# Patient Record
Sex: Female | Born: 1965 | Race: White | Hispanic: No | Marital: Married | Smoking: Never smoker
Health system: Southern US, Community
[De-identification: ages and names within clinical notes are randomized; demographics above are authoritative.]

## PROBLEM LIST (undated history)

## (undated) DIAGNOSIS — T7840XA Allergy, unspecified, initial encounter: Secondary | ICD-10-CM

## (undated) DIAGNOSIS — C649 Malignant neoplasm of unspecified kidney, except renal pelvis: Secondary | ICD-10-CM

## (undated) DIAGNOSIS — R87619 Unspecified abnormal cytological findings in specimens from cervix uteri: Secondary | ICD-10-CM

## (undated) DIAGNOSIS — I10 Essential (primary) hypertension: Secondary | ICD-10-CM

## (undated) DIAGNOSIS — D649 Anemia, unspecified: Secondary | ICD-10-CM

## (undated) DIAGNOSIS — T148XXA Other injury of unspecified body region, initial encounter: Secondary | ICD-10-CM

## (undated) DIAGNOSIS — R7611 Nonspecific reaction to tuberculin skin test without active tuberculosis: Secondary | ICD-10-CM

## (undated) DIAGNOSIS — M25559 Pain in unspecified hip: Secondary | ICD-10-CM

## (undated) DIAGNOSIS — R319 Hematuria, unspecified: Secondary | ICD-10-CM

## (undated) DIAGNOSIS — M1990 Unspecified osteoarthritis, unspecified site: Secondary | ICD-10-CM

## (undated) HISTORY — PX: CARPAL TUNNEL RELEASE: SHX101

## (undated) HISTORY — PX: NASAL SINUS SURGERY: SHX719

## (undated) HISTORY — PX: CHOLECYSTECTOMY: SHX55

## (undated) HISTORY — DX: Allergy, unspecified, initial encounter: T78.40XA

## (undated) HISTORY — PX: FOOT SURGERY: SHX648

## (undated) HISTORY — DX: Unspecified abnormal cytological findings in specimens from cervix uteri: R87.619

## (undated) HISTORY — DX: Unspecified osteoarthritis, unspecified site: M19.90

## (undated) HISTORY — PX: CERVICAL CONIZATION W/BX: SHX1330

## (undated) HISTORY — PX: HEEL SPUR SURGERY: SHX665

## (undated) HISTORY — DX: Malignant neoplasm of unspecified kidney, except renal pelvis: C64.9

---

## 1997-11-29 ENCOUNTER — Other Ambulatory Visit: Admission: RE | Admit: 1997-11-29 | Discharge: 1997-11-29 | Payer: Self-pay | Admitting: Gynecology

## 2000-02-03 ENCOUNTER — Other Ambulatory Visit: Admission: RE | Admit: 2000-02-03 | Discharge: 2000-02-03 | Payer: Self-pay | Admitting: *Deleted

## 2000-03-29 ENCOUNTER — Inpatient Hospital Stay (HOSPITAL_COMMUNITY): Admission: AD | Admit: 2000-03-29 | Discharge: 2000-03-29 | Payer: Self-pay | Admitting: Obstetrics & Gynecology

## 2002-10-20 ENCOUNTER — Other Ambulatory Visit: Admission: RE | Admit: 2002-10-20 | Discharge: 2002-10-20 | Payer: Self-pay | Admitting: *Deleted

## 2002-11-24 ENCOUNTER — Ambulatory Visit (HOSPITAL_COMMUNITY): Admission: RE | Admit: 2002-11-24 | Discharge: 2002-11-24 | Payer: Self-pay | Admitting: *Deleted

## 2002-11-24 ENCOUNTER — Encounter: Payer: Self-pay | Admitting: *Deleted

## 2002-12-29 ENCOUNTER — Inpatient Hospital Stay (HOSPITAL_COMMUNITY): Admission: AD | Admit: 2002-12-29 | Discharge: 2002-12-29 | Payer: Self-pay | Admitting: Obstetrics and Gynecology

## 2002-12-29 ENCOUNTER — Encounter: Payer: Self-pay | Admitting: Obstetrics and Gynecology

## 2008-08-30 ENCOUNTER — Ambulatory Visit (HOSPITAL_BASED_OUTPATIENT_CLINIC_OR_DEPARTMENT_OTHER): Admission: RE | Admit: 2008-08-30 | Discharge: 2008-08-30 | Payer: Self-pay | Admitting: Orthopedic Surgery

## 2008-10-11 ENCOUNTER — Ambulatory Visit (HOSPITAL_BASED_OUTPATIENT_CLINIC_OR_DEPARTMENT_OTHER): Admission: RE | Admit: 2008-10-11 | Discharge: 2008-10-11 | Payer: Self-pay | Admitting: Orthopedic Surgery

## 2010-06-23 LAB — POCT HEMOGLOBIN-HEMACUE: Hemoglobin: 11.7 g/dL — ABNORMAL LOW (ref 12.0–15.0)

## 2010-06-24 LAB — POCT HEMOGLOBIN-HEMACUE: Hemoglobin: 11.4 g/dL — ABNORMAL LOW (ref 12.0–15.0)

## 2010-07-30 NOTE — Op Note (Signed)
[name], [name]              ACCOUNT NO.:  [id number]   MEDICAL RECORD NO.:  [id number]          PATIENT TYPE:  AMB   LOCATION:  DSC                          FACILITY:  [facility]   PHYSICIAN:  Cindee Salt, M.D.       DATE OF BIRTH:  [birth year]   DATE OF PROCEDURE:  10/11/2008  DATE OF DISCHARGE:                               OPERATIVE REPORT   PREOPERATIVE DIAGNOSIS:  Carpal tunnel syndrome, left hand.   POSTOPERATIVE DIAGNOSIS:  Carpal tunnel syndrome, left hand.   OPERATION:  Decompression of left median nerve.   SURGEON:  Cindee Salt, MD   ASSISTANT:  Carolyne Fiscal, RN   ANESTHESIA:  General.   ANESTHESIOLOGIST:  Bedelia Person, MD   HISTORY:  The patient is a 45 year old female with a history of carpal  tunnel syndrome bilaterally.  She was admitted for release of her left  carpal tunnel.  Pre, peri, and postoperative courses have been discussed  along with risks and complication.  She is aware that there is no  guarantee with the surgery, possibility of infection, recurrence of  injury to arteries, nerves, and tendons, incomplete relief of symptoms,  and dystrophy.  In the preoperative area, the patient is seen.  The  extremity was marked by both the patient and surgeon.  Antibiotic was  given.   PROCEDURE:  The patient was brought to the operating room where a  general anesthetic was carried out without difficulty under the  direction of Dr. Gypsy Balsam.  She was prepped using ChloraPrep, supine  position, left arm free, 3-minute dry time was taken along with the  timeout.  She was draped.  The limb exsanguinated with an Esmarch  bandage.  Tourniquet was placed on the forearm and inflated to 230 mmHg.  A longitudinal incision was made in the palm carried down through the  subcutaneous tissue.  Bleeders were electrocauterized.  Palmar fascia  was split.  Superficial palmar arch identified.  Flexor tendon to the  ring little finger identified to the ulnar side of the median nerve.  The  carpal retinaculum was incised with sharp dissection.  A right-angle  and Sewall retractor were placed between the skin and forearm fascia.  The fascia released for approximately 1.5 cm proximal to the wrist  crease under direct vision.  Canal was explored.  Area of compression of  the nerve was apparent.  No further lesions were identified.  The wound  was irrigated and skin closed with interrupted 5-0 Vicryl Rapide  sutures.  Local infiltration was given with 0.25% Marcaine without  epinephrine, approximately 6 mL was used.  A sterile compressive  dressing  and splint was applied.  Deflation of the tourniquet, all fingers  immediately pinked.  She was taken to the recovery room for observation  in satisfactory condition.  She will be discharged home to return to the  [facility] [place] in 1 week on Darvocet at her request.           ______________________________  Cindee Salt, M.D.     GK/MEDQ  D:  10/11/2008  T:  10/12/2008  Job:  [id number] 

## 2010-07-30 NOTE — Op Note (Signed)
[name], [name]              ACCOUNT NO.:  [id number]   MEDICAL RECORD NO.:  [id number]          PATIENT TYPE:  AMB   LOCATION:  DSC                          FACILITY:  [facility]   PHYSICIAN:  Cindee Salt, M.D.       DATE OF BIRTH:  08-Jul-1965   DATE OF PROCEDURE:  08/30/2008  DATE OF DISCHARGE:                               OPERATIVE REPORT   PREOPERATIVE DIAGNOSIS:  Carpal tunnel syndrome, right hand.   POSTOPERATIVE DIAGNOSIS:  Carpal tunnel syndrome, right hand.   OPERATION:  Decompression of right median nerve.   SURGEON:  Cindee Salt, MD   ASSISTANT:  Carolyne Fiscal, RN   ANESTHESIA:  Forearm-based IV regional general with local infiltration.   ANESTHESIOLOGIST:  Quita Skye. Krista Blue, MD   HISTORY:  The patient is a 45 year old female with a history of carpal  tunnel syndrome, EMG nerve conduction is positive.  This has not  responded to conservative treatment.  She has elected to undergo  surgical decompression.  Pre, peri, and postoperative courses have been  discussed along with risks and complications.  She is aware that there  was no guarantee with the surgery, possibility of infection, recurrence  of injury to arteries, nerves, and tendons, incomplete relief of  symptoms, and dystrophy.  In the preoperative area, the patient was  seen.  The extremity was marked by both the patient and surgeon.  Antibiotic was given.   PROCEDURE:  The patient was brought to the operating room where a  forearm-based IV regional anesthetic was carried out without difficulty.  Her blood pressure went up producing a venous tourniquet with increased  pain.  A general anesthetic was then afforded under the direction of Dr.  Krista Blue.  She was prepped using DuraPrep supine position with the right  arm free.  The tourniquet was deflated.  A 3-minute dry time was  allowed.  A time-out was taken confirming the patient and procedure.  The patient was then draped.  The limb exsanguinated with an Esmarch  bandage.  Tourniquet placed on the forearm inflated to 280 mmHg.  A  longitudinal incision was made in the palm carried down through the  subcutaneous tissue.  Bleeders were electrocauterized.  Palmar fascia  was split.  Superficial palmar arch identified.  The flexor tendon to  the ring little finger identified to the ulnar side of the median nerve.  The carpal retinaculum was incised with sharp dissection.  A right angle  and Sewall retractor were placed between skin and forearm fascia.  Fascia was released for approximately a centimeter and half proximal to  the wrist crease under direct vision.  The canal was explored.  No  further lesions were identified.  The area of compression to the nerve  was apparent.  The wound was copiously irrigated with saline.  The skin  was closed with interrupted 5-0 Vicryl Rapide sutures.  Hemostasis was  afforded throughout the procedure using bipolar.  The area was then  locally infiltrated with 0.25% Marcaine without epinephrine, 5 mL used.  A sterile compressive dressing  and splint applied.  Deflation of the tourniquet,  all fingers  immediately pinked.  She was taken to the recovery room for observation  in satisfactory condition.  She will be discharged home to return to the  [facility] [place] in 1 week on Nucynta.           ______________________________  Cindee Salt, M.D.     GK/MEDQ  D:  08/30/2008  T:  08/31/2008  Job:  [id number]

## 2010-08-02 NOTE — Consult Note (Signed)
   [name], [name]                        ACCOUNT NO.:  [id number]   MEDICAL RECORD NO.:  [id number]                   PATIENT TYPE:  MAT   LOCATION:  MATC                                 FACILITY:  [facility]   PHYSICIAN:  Lenoard Aden, M.D.             DATE OF BIRTH:  02-Dec-1965   DATE OF CONSULTATION:  12/29/2002  DATE OF DISCHARGE:                                   CONSULTATION   CONSULTING PHYSICIAN:  Lenoard Aden, M.D.   CHIEF COMPLAINT:  Bleeding.   HISTORY OF PRESENT ILLNESS:  This patient is a 45 year old white female, G1,  P0 with history of donor IUI conception at approximately four weeks  gestation with cramping and spotting this evening.   PAST MEDICAL HISTORY:  Past medical history is remarkable for female factor  infertility.  No other medical or surgical hospitalizations.   ALLERGIES:  No known drug allergies.   MEDICATIONS:  Prenatal vitamins.   PHYSICAL EXAMINATION:  VITAL SIGNS:  Temperature 98.4, pulse 88,  respirations 20 and blood pressure 131/76.  HEENT:  Normal.  LUNGS:  Clear.  HEART: Regular rate and rhythm.  ABDOMEN: Soft and nontender.  PELVIC EXAMINATION:  pelvic exam reveals a normal-sized uterus.  No adnexal  masses.  Ultrasound confirms a thickened endometrium, but no evidence of intrauterine  sac.  No evidence of ectopic.  Small corpus luteum cyst noted.  EXTREMITIES:  Extremities reveal no cords bilaterally.  NEUROLOGIC EXAMINATION:  Nonfocal.   LABORATORY DATA:  Quantitative HCG of 99 is noted.  Blood type is A  positive.   IMPRESSION:  Probable complete abortion with a drop in human chorionic  gonadotropin titer from 162 on December 19, 2002 to 99 today.  The patient is  bleeding minimally at this time and is stable on physical examination.   The patient will follow up in the office in one week for quantitative HCG  and follow the HCG down to zero, then with normal cycle will proceed with  resumption of Clomid use intrauterine  imsemination.                                                 Lenoard Aden, M.D.    RJT/MEDQ  D:  12/29/2002  T:  12/29/2002  Job:  [id number]   cc:   [facility]

## 2014-01-02 ENCOUNTER — Ambulatory Visit (INDEPENDENT_AMBULATORY_CARE_PROVIDER_SITE_OTHER): Payer: BC Managed Care – PPO | Admitting: Sports Medicine

## 2014-01-02 ENCOUNTER — Emergency Department (INDEPENDENT_AMBULATORY_CARE_PROVIDER_SITE_OTHER): Payer: BC Managed Care – PPO

## 2014-01-02 ENCOUNTER — Encounter: Payer: Self-pay | Admitting: Emergency Medicine

## 2014-01-02 ENCOUNTER — Emergency Department (INDEPENDENT_AMBULATORY_CARE_PROVIDER_SITE_OTHER)
Admission: EM | Admit: 2014-01-02 | Discharge: 2014-01-02 | Disposition: A | Discharge status: Home / Self Care | Payer: BC Managed Care – PPO | Source: Home / Self Care | Attending: Emergency Medicine | Admitting: Emergency Medicine

## 2014-01-02 DIAGNOSIS — M25562 Pain in left knee: Secondary | ICD-10-CM

## 2014-01-02 DIAGNOSIS — M2242 Chondromalacia patellae, left knee: Secondary | ICD-10-CM

## 2014-01-02 DIAGNOSIS — M170 Bilateral primary osteoarthritis of knee: Secondary | ICD-10-CM | POA: Insufficient documentation

## 2014-01-02 DIAGNOSIS — M25462 Effusion, left knee: Secondary | ICD-10-CM

## 2014-01-02 HISTORY — DX: Essential (primary) hypertension: I10

## 2014-01-02 MED ORDER — DICLOFENAC SODIUM 75 MG PO TBEC: 75.0000 mg | DELAYED_RELEASE_TABLET | Freq: Two times a day (BID) | ORAL | Status: DC

## 2014-01-02 NOTE — ED Provider Notes (Signed)
CSN: [id number]     Arrival date & time 01/02/14  4010 History   First MD Initiated Contact with Patient 01/02/14 0820     Chief Complaint  Patient presents with  . Knee Pain    left    HPI  Pt presents today with chief complaint of knee pain Pt states that she has had recurring knee pain over the past 2 months  No known injury per pt Though she walks for extended periods of time in the hospital  States pain is mainly anterior, though she also has post knee fullness  Knee pain worse in anterior compartment with bending Mild knee giving away at times  No prior hx/o knee injury in the past Saw PCP for sxs w/in the past month-was given mobic and muscle relaxer with minimal improvement in sxs.   Past Medical History  Diagnosis Date  . Hypertension    Past Surgical History  Procedure Laterality Date  . Nasal sinus surgery    . Carpal tunnel release    . Heel spur surgery    . Cholecystectomy     Family History  Problem Relation Age of Onset  . Heart disease Mother   . Heart disease Father    History  Substance Use Topics  . Smoking status: Never Smoker   . Smokeless tobacco: Never Used  . Alcohol Use: No   OB History   Grav Para Term Preterm Abortions TAB SAB Ect Mult Living                 Review of Systems  All other systems reviewed and are negative.   Allergies  Codeine  Home Medications   Prior to Admission medications   Medication Sig Start Date End Date Taking? Authorizing Provider  hydrochlorothiazide (HYDRODIURIL) 25 MG tablet Take 25 mg by mouth daily.   Yes Historical Provider, MD  meloxicam (MOBIC) 15 MG tablet Take 15 mg by mouth daily.   Yes Historical Provider, MD   LMP 11/25/2013 Physical Exam  Constitutional: She appears well-developed.  moderately obese    HENT:  Head: Normocephalic and atraumatic.  Eyes: Conjunctivae are normal. Pupils are equal, round, and reactive to light.  Cardiovascular: Normal rate and regular rhythm.    Pulmonary/Chest: Effort normal and breath sounds normal.  Abdominal: Soft.  Musculoskeletal:  + TTP across anterior knee, mainly over patellar tendon + pain with resisted knee flexion and extension + mild fullness in post knee   Neurological: She is alert.  Skin: Skin is warm.    ED Course  Procedures (including critical care time) Labs Review Labs Reviewed - No data to display  Imaging Review Dg Knee Complete 4 Views Left  01/02/2014   CLINICAL DATA:  Anterior left knee pain. No known injury. Initial encounter.  EXAM: LEFT KNEE - COMPLETE 4+ VIEW  COMPARISON:  None.  FINDINGS: The patient has a moderate to large large joint effusion. No fracture or dislocation is identified. No focal bony lesion is seen. Joint spaces are preserved. No chondrocalcinosis is identified.  IMPRESSION: Moderate to large joint effusion without underlying bony or joint abnormality.   Electronically Signed   By: Inge Rise M.D.   On: 01/02/2014 09:21     MDM   1. Knee pain, left   2. Knee effusion, left    Will likely need therapeutic draining of L knee  Will formally consult sports medicine as ? U/s guided drainage may be more beneficial  Discussed plan-pt is agreeable  Follow up and treatment per sports medicine    The patient and/or caregiver has been counseled thoroughly with regard to treatment plan and/or medications prescribed including dosage, schedule, interactions, rationale for use, and possible side effects and they verbalize understanding. Diagnoses and expected course of recovery discussed and will return if not improved as expected or if the condition worsens. Patient and/or caregiver verbalized understanding.         Shanda Howells, MD 01/02/14 1001

## 2014-01-02 NOTE — Progress Notes (Signed)
   Subjective:    I'm seeing this patient as a consultation for:  Dr. Shanda Howells  CC: Left knee pain  HPI: This is a very pleasant 48 year old female, for some time now she's had pain that she localizes under the left kneecap, moderate, persistent without radiation. Worse with going up and down stairs, squatting, and getting up out of a chair.  Past medical history, Surgical history, Family history not pertinant except as noted below, Social history, Allergies, and medications have been entered into the medical record, reviewed, and no changes needed.   Review of Systems: No headache, visual changes, nausea, vomiting, diarrhea, constipation, dizziness, abdominal pain, skin rash, fevers, chills, night sweats, weight loss, swollen lymph nodes, body aches, joint swelling, muscle aches, chest pain, shortness of breath, mood changes, visual or auditory hallucinations.   Objective:   General: Well Developed, well nourished, and in no acute distress.  Neuro/Psych: Alert and oriented x3, extra-ocular muscles intact, able to move all 4 extremities, sensation grossly intact. Skin: Warm and dry, no rashes noted.  Respiratory: Not using accessory muscles, speaking in full sentences, trachea midline.  Cardiovascular: Pulses palpable, no extremity edema. Abdomen: Does not appear distended. Left Knee: Visible and palpable effusion. Tender to palpation under the medial and lateral patellar facets. ROM normal in flexion and extension and lower leg rotation. Ligaments with solid consistent endpoints including ACL, PCL, LCL, MCL. Negative Mcmurray's and provocative meniscal tests. Non painful patellar compression. Patellar and quadriceps tendons unremarkable. Hamstring and quadriceps strength is normal.  Procedure: Real-time Ultrasound Guided aspiration/Injection of left knee Device: GE Logiq E  Verbal informed consent obtained.  Time-out conducted.  Noted no overlying erythema, induration, or  other signs of local infection.  Skin prepped in a sterile fashion.  Local anesthesia: Topical Ethyl chloride.  With sterile technique and under real time ultrasound guidance:  20 cc of straw-colored fluid was aspirated, syringe which and 2 cc Kenalog 40, 4 cc lidocaine injected easily. Completed without difficulty  Pain immediately resolved suggesting accurate placement of the medication.  Advised to call if fevers/chills, erythema, induration, drainage, or persistent bleeding.  Images permanently stored and available for review in the ultrasound unit.  Impression: Technically successful ultrasound guided injection.  Impression and Recommendations:   This case required medical decision making of moderate complexity.

## 2014-01-02 NOTE — ED Notes (Signed)
[name] c/o left knee pain without injury x 2 months. Seen by PCP who gave Meloxicam and a muscle relaxer, no xray done. No previous injuries.

## 2014-01-02 NOTE — ED Provider Notes (Signed)
CSN: [id number]     Arrival date & time 01/02/14  2878 History   First MD Initiated Contact with Patient 01/02/14 0820     Chief Complaint  Patient presents with  . Knee Pain    left   (Consider location/radiation/quality/duration/timing/severity/associated sxs/prior Treatment) HPI [name] c/o left knee pain without injury x 2 months. Seen by PCP who gave Meloxicam and a muscle relaxer, no xray done. No previous injuries. The left anterior knee pain is worse when walking downhill. No chest pain or shortness of breath. 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 Hypertension    Past Surgical History  Procedure Laterality Date  . Nasal sinus surgery    . Carpal tunnel release    . Heel spur surgery    . Cholecystectomy     Family History  Problem Relation Age of Onset  . Heart disease Mother   . Heart disease Father    History  Substance Use Topics  . Smoking status: Never Smoker   . Smokeless tobacco: Never Used  . Alcohol Use: No   OB History   Grav Para Term Preterm Abortions TAB SAB Ect Mult Living                 Review of Systems  All other systems reviewed and are negative.   Allergies  Codeine  Home Medications   Prior to Admission medications   Medication Sig Start Date End Date Taking? Authorizing Provider  hydrochlorothiazide (HYDRODIURIL) 25 MG tablet Take 25 mg by mouth daily.   Yes Historical Provider, MD  meloxicam (MOBIC) 15 MG tablet Take 15 mg by mouth daily.   Yes Historical Provider, MD   LMP 11/25/2013 Physical Exam  Nursing note and vitals reviewed. Constitutional: She is oriented to person, place, and time. She appears well-developed and well-nourished. No distress.  HENT:  Head: Normocephalic and atraumatic.  Eyes: Conjunctivae and EOM are normal. Pupils are equal, round, and reactive to  light. No scleral icterus.  Neck: Normal range of motion.  Cardiovascular: Normal rate.   Pulmonary/Chest: Effort normal.  Abdominal: She exhibits no distension.  Musculoskeletal: Normal range of motion.       Left knee: She exhibits swelling. Tenderness found. Medial joint line, lateral joint line and patellar tendon tenderness noted.  Some superficial varicosities left lower extremity. No calf tenderness. No cords felt. No significant pretibial edema. Negative Homans sign  Neurological: She is alert and oriented to person, place, and time.  Skin: Skin is warm.  Psychiatric: She has a normal mood and affect.   Very tender over patella tendon ED Course  Procedures (including critical care time) Labs Review Labs Reviewed - No data to display  Imaging Review No results found. Note: I was just called to an emergency situation in another department in the [facility]. Dr. Shanda Howells will be continuing care for patient. I've ordered x-ray left knee. I informed patient that Dr. Ernestina Patches will be reevaluating and treating patient when x-ray comes back.  MDM  No diagnosis found. Left knee pain    Jacqulyn Cane, MD 01/02/14 [phone]

## 2014-01-02 NOTE — Assessment & Plan Note (Addendum)
Aspiration of 20 cc, injection. Voltaren. Formal physical therapy. Return in one month.

## 2014-05-11 ENCOUNTER — Encounter: Payer: Self-pay | Admitting: Sports Medicine

## 2014-05-11 ENCOUNTER — Ambulatory Visit (INDEPENDENT_AMBULATORY_CARE_PROVIDER_SITE_OTHER): Payer: Self-pay | Admitting: Sports Medicine

## 2014-05-11 DIAGNOSIS — M2242 Chondromalacia patellae, left knee: Secondary | ICD-10-CM

## 2014-05-11 NOTE — Progress Notes (Signed)
  Subjective:    CC: left knee pain  HPI: This is a pleasant 49 year old female, she is obese, she has left knee patellofemoral chondromalacia, 3-4 months ago we didn't aspiration and injection and she did very well. She has not had any physical therapy, and is now having recurrence of pain. Is moderate, persistent with swelling, and localized under the kneecap, worse with going down stairs. No mechanical symptoms.  Past medical history, Surgical history, Family history not pertinant except as noted below, Social history, Allergies, and medications have been entered into the medical record, reviewed, and no changes needed.   Review of Systems: No fevers, chills, night sweats, weight loss, chest pain, or shortness of breath.   Objective:    General: Well Developed, well nourished, and in no acute distress.  Neuro: Alert and oriented x3, extra-ocular muscles intact, sensation grossly intact.  HEENT: Normocephalic, atraumatic, pupils equal round reactive to light, neck supple, no masses, no lymphadenopathy, thyroid nonpalpable.  Skin: Warm and dry, no rashes. Cardiac: Regular rate and rhythm, no murmurs rubs or gallops, no lower extremity edema.  Respiratory: Clear to auscultation bilaterally. Not using accessory muscles, speaking in full sentences. Left Knee: Visible and palpable effusion, mild, with tenderness at the patellar facets. ROM normal in flexion and extension and lower leg rotation. Ligaments with solid consistent endpoints including ACL, PCL, LCL, MCL. Negative Mcmurray's and provocative meniscal tests. Non painful patellar compression. Patellar and quadriceps tendons unremarkable. Hamstring and quadriceps strength is normal. Hip abductor's are significantly weak on the left side compared to the right  Procedure: Real-time Ultrasound Guided Aspiration/Injection of left knee Device: GE Logiq E  Verbal informed consent obtained.  Time-out conducted.  Noted no overlying  erythema, induration, or other signs of local infection.  Skin prepped in a sterile fashion.  Local anesthesia: Topical Ethyl chloride.  With sterile technique and under real time ultrasound guidance:  10 mL straw-colored fluid aspirated, syringe switched and 2 mL kenalog 40, 4 mL lidocaine injected easily Completed without difficulty  Pain immediately resolved suggesting accurate placement of the medication.  Advised to call if fevers/chills, erythema, induration, drainage, or persistent bleeding.  Images permanently stored and available for review in the ultrasound unit.  Impression: Technically successful ultrasound guided injection.  Impression and Recommendations:    I spent 40 minutes with this patient, greater than 50% was face-to-face time counseling regarding the above diagnoses,describing the pathophysiology and biomechanics of her disease.

## 2014-05-11 NOTE — Assessment & Plan Note (Signed)
Repeat aspiration and injection today. Formal physical therapy. She will work with her PCP on weight loss, and possibly weight loss medication. I've also given her a handout on Orthovisc. Hip abductor's are significantly weak.

## 2014-06-07 ENCOUNTER — Ambulatory Visit: Payer: Self-pay | Admitting: Sports Medicine

## 2014-06-21 ENCOUNTER — Encounter: Payer: Self-pay | Admitting: Sports Medicine

## 2014-06-21 ENCOUNTER — Ambulatory Visit (INDEPENDENT_AMBULATORY_CARE_PROVIDER_SITE_OTHER): Payer: BLUE CROSS/BLUE SHIELD | Admitting: Sports Medicine

## 2014-06-21 VITALS — BP 111/70 | HR 89 | Ht 63.0 in | Wt 234.0 lb

## 2014-06-21 DIAGNOSIS — M2242 Chondromalacia patellae, left knee: Secondary | ICD-10-CM | POA: Diagnosis not present

## 2014-06-21 DIAGNOSIS — E669 Obesity, unspecified: Secondary | ICD-10-CM

## 2014-06-21 MED ORDER — PHENTERMINE HCL 37.5 MG PO TABS: ORAL_TABLET | ORAL | Status: DC

## 2014-06-21 NOTE — Progress Notes (Signed)
  Subjective:    CC: follow-up  HPI: Left knee pain: Clinically represents patellofemoral chondromalacia with mild osteoarthritis, pain is moderate, persistent, she responded well to an injection however still has some pain, did not do any physical therapy due to costs.  Unfortunately she is also not seen her primary care provider to discuss weight loss, she has in fact gained weight.  Past medical history, Surgical history, Family history not pertinant except as noted below, Social history, Allergies, and medications have been entered into the medical record, reviewed, and no changes needed.   Review of Systems: No fevers, chills, night sweats, weight loss, chest pain, or shortness of breath.   Objective:    General: Well Developed, well nourished, and in no acute distress.  Neuro: Alert and oriented x3, extra-ocular muscles intact, sensation grossly intact.  HEENT: Normocephalic, atraumatic, pupils equal round reactive to light, neck supple, no masses, no lymphadenopathy, thyroid nonpalpable.  Skin: Warm and dry, no rashes. Cardiac: Regular rate and rhythm, no murmurs rubs or gallops, no lower extremity edema.  Respiratory: Clear to auscultation bilaterally. Not using accessory muscles, speaking in full sentences. Left Knee: Normal to inspection with no erythema or effusion or obvious bony abnormalities. Still with pain around the patellar facets. ROM normal in flexion and extension and lower leg rotation. Ligaments with solid consistent endpoints including ACL, PCL, LCL, MCL. Negative Mcmurray's and provocative meniscal tests. Non painful patellar compression. Patellar and quadriceps tendons unremarkable. Hamstring and quadriceps strength is normal. Hip abductor's are still considerably weak on the left side.  We spent a considerable amount of time teaching her various hip abductor rehabilitation exercises.  Impression and Recommendations:

## 2014-06-21 NOTE — Assessment & Plan Note (Signed)
Has not yet been able to get phentermine from her PCP, I am going to start it, and she can continue with her PCP for monthly weight checks and refills.

## 2014-06-21 NOTE — Assessment & Plan Note (Signed)
Improved with the previous aspiration and injection, continuing to have pain, has not taken any physical therapy, or worked on weight loss. I taught [name] hip abductor exercises today, and we have provided 2 yards of therapy and in 2 different strengths. She will do 3 sets of 15 with both side walking, as well as hip abduction's. Return to see me in one month. I would also like to get [name] approved for Orthovisc.

## 2014-06-23 ENCOUNTER — Telehealth: Payer: Self-pay | Admitting: Physician Assistant

## 2014-06-23 NOTE — Telephone Encounter (Signed)
[name], would you be able to check for her preferred agent, it may simply be synvisc, supartz, or euflexxa.  If something else is covered I'll just give her an rx and she will bring the boxes here.

## 2014-06-23 NOTE — Telephone Encounter (Signed)
Patient was interested in OrthoVisc injections for her knee pain. Received information regarding insurance coverage, these injections will not be covered. Attempted to contact Pt to advise, no answer. Left voicemail and provided callback information.

## 2014-06-23 NOTE — Telephone Encounter (Signed)
Called Pt's insurance to see what injections would be approved. Spoke with Jenny Reichmann, a representative, and was able to complete a prior authorization for OrthoVisc. Auth #: [id number] Valid dates (05/24/14-07/28/14). Was informed these injections would need to be filled using [facility].  [place], [phone], and spoke with Inez Catalina- was advised the total OOP cost for the patient for all injections would be $295.28. Contacted Pt, advised of her OOP cost. Pt states she wants to think about this price and will let us know if she wants to order them. verbalized understanding and reminded Pt of her valid dates. No further questions at this time.

## 2014-07-20 ENCOUNTER — Ambulatory Visit (INDEPENDENT_AMBULATORY_CARE_PROVIDER_SITE_OTHER): Payer: BLUE CROSS/BLUE SHIELD | Admitting: Sports Medicine

## 2014-07-20 ENCOUNTER — Encounter: Payer: Self-pay | Admitting: Sports Medicine

## 2014-07-20 VITALS — BP 130/79 | HR 81 | Wt 229.0 lb

## 2014-07-20 DIAGNOSIS — E669 Obesity, unspecified: Secondary | ICD-10-CM

## 2014-07-20 DIAGNOSIS — M2242 Chondromalacia patellae, left knee: Secondary | ICD-10-CM

## 2014-07-20 MED ORDER — PHENTERMINE HCL 37.5 MG PO TABS: ORAL_TABLET | ORAL | Status: DC

## 2014-07-20 NOTE — Assessment & Plan Note (Signed)
Small amount of weight loss as we entered the second month. We are switching to one half tab twice a day

## 2014-07-20 NOTE — Progress Notes (Signed)
  Subjective:    CC: follow-up  HPI: Obesity: Minimal weight loss. No side effects from medication.  Left patellofemoral chondromalacia: Improved significantly since the injection but she has not done any of her exercises.  Past medical history, Surgical history, Family history not pertinant except as noted below, Social history, Allergies, and medications have been entered into the medical record, reviewed, and no changes needed.   Review of Systems: No fevers, chills, night sweats, weight loss, chest pain, or shortness of breath.   Objective:    General: Well Developed, well nourished, and in no acute distress.  Neuro: Alert and oriented x3, extra-ocular muscles intact, sensation grossly intact.  HEENT: Normocephalic, atraumatic, pupils equal round reactive to light, neck supple, no masses, no lymphadenopathy, thyroid nonpalpable.  Skin: Warm and dry, no rashes. Cardiac: Regular rate and rhythm, no murmurs rubs or gallops, no lower extremity edema.  Respiratory: Clear to auscultation bilaterally. Not using accessory muscles, speaking in full sentences.  Impression and Recommendations:

## 2014-07-20 NOTE — Assessment & Plan Note (Signed)
Pain has improved but has not done her exercises. She will work harder on her compliance over the next month.

## 2014-08-17 ENCOUNTER — Ambulatory Visit (INDEPENDENT_AMBULATORY_CARE_PROVIDER_SITE_OTHER): Payer: BLUE CROSS/BLUE SHIELD | Admitting: Sports Medicine

## 2014-08-17 ENCOUNTER — Encounter: Payer: Self-pay | Admitting: Sports Medicine

## 2014-08-17 ENCOUNTER — Telehealth: Payer: Self-pay | Admitting: Sports Medicine

## 2014-08-17 DIAGNOSIS — E669 Obesity, unspecified: Secondary | ICD-10-CM

## 2014-08-17 MED ORDER — MELOXICAM 15 MG PO TABS: 15.0000 mg | ORAL_TABLET | Freq: Every day | ORAL | Status: DC

## 2014-08-17 MED ORDER — PHENTERMINE HCL 37.5 MG PO TABS: ORAL_TABLET | ORAL | Status: DC

## 2014-08-17 MED ORDER — LIRAGLUTIDE -WEIGHT MANAGEMENT 18 MG/3ML ~~LOC~~ SOPN: 3.0000 mg | PEN_INJECTOR | Freq: Every day | SUBCUTANEOUS | Status: DC

## 2014-08-17 NOTE — Telephone Encounter (Signed)
Pt called and stated that she can not afford the saxenda you prescribed to her so she will not be getting the prescription filled and has cancelled her nurse visit.

## 2014-08-17 NOTE — Assessment & Plan Note (Signed)
9 pound additional weight loss as we entered the third month. I am going to add Saxenda. Patient were to return at a nurse visit to learn how to administer the injection. Discount coupon given. Return in one month.

## 2014-08-17 NOTE — Telephone Encounter (Signed)
Dr. Darene Lamer please see note below. Vaniya Augspurger,CMA

## 2014-08-17 NOTE — Telephone Encounter (Signed)
Okay, as long as she ran it through the discount card and it was still too expensive. Please make sure of this.

## 2014-08-17 NOTE — Progress Notes (Signed)
  Subjective:    CC: Weight check  HPI: [name] has lost an additional 9 pounds, she does desire to accelerate her weight loss, and is amenable to trying additional medication. No side effects and happy with how things are going so far.  Past medical history, Surgical history, Family history not pertinant except as noted below, Social history, Allergies, and medications have been entered into the medical record, reviewed, and no changes needed.   Review of Systems: No fevers, chills, night sweats, weight loss, chest pain, or shortness of breath.   Objective:    General: Well Developed, well nourished, and in no acute distress.  Neuro: Alert and oriented x3, extra-ocular muscles intact, sensation grossly intact.  HEENT: Normocephalic, atraumatic, pupils equal round reactive to light, neck supple, no masses, no lymphadenopathy, thyroid nonpalpable.  Skin: Warm and dry, no rashes. Cardiac: Regular rate and rhythm, no murmurs rubs or gallops, no lower extremity edema.  Respiratory: Clear to auscultation bilaterally. Not using accessory muscles, speaking in full sentences.  Impression and Recommendations:

## 2014-08-18 ENCOUNTER — Ambulatory Visit: Payer: BLUE CROSS/BLUE SHIELD

## 2014-08-18 NOTE — Telephone Encounter (Signed)
Spoke to patient she stated that she did run it through discount card. Rhonda Cunningham,CMA

## 2014-09-21 ENCOUNTER — Encounter: Payer: Self-pay | Admitting: Sports Medicine

## 2014-09-21 ENCOUNTER — Ambulatory Visit (INDEPENDENT_AMBULATORY_CARE_PROVIDER_SITE_OTHER): Payer: BLUE CROSS/BLUE SHIELD | Admitting: Sports Medicine

## 2014-09-21 VITALS — BP 116/67 | HR 89 | Wt 211.0 lb

## 2014-09-21 DIAGNOSIS — D649 Anemia, unspecified: Secondary | ICD-10-CM | POA: Diagnosis not present

## 2014-09-21 DIAGNOSIS — E669 Obesity, unspecified: Secondary | ICD-10-CM

## 2014-09-21 LAB — CBC WITH DIFFERENTIAL/PLATELET
Basophils Absolute: 0.1 K/uL (ref 0.0–0.1)
Basophils Relative: 1 % (ref 0–1)
Eosinophils Absolute: 0.3 K/uL (ref 0.0–0.7)
Eosinophils Relative: 3 % (ref 0–5)
HCT: 35.8 % — ABNORMAL LOW (ref 36.0–46.0)
Hemoglobin: 11.7 g/dL — ABNORMAL LOW (ref 12.0–15.0)
Lymphocytes Relative: 25 % (ref 12–46)
Lymphs Abs: 2.8 10*3/uL (ref 0.7–4.0)
MCH: 27.6 pg (ref 26.0–34.0)
MCHC: 32.7 g/dL (ref 30.0–36.0)
MCV: 84.4 fL (ref 78.0–100.0)
MPV: 9.6 fL (ref 8.6–12.4)
Monocytes Absolute: 0.6 10*3/uL (ref 0.1–1.0)
Monocytes Relative: 5 % (ref 3–12)
Neutro Abs: 7.5 K/uL (ref 1.7–7.7)
Neutrophils Relative %: 66 % (ref 43–77)
Platelets: 356 K/uL (ref 150–400)
RBC: 4.24 MIL/uL (ref 3.87–5.11)
RDW: 15.2 % (ref 11.5–15.5)
WBC: 11.3 10*3/uL — ABNORMAL HIGH (ref 4.0–10.5)

## 2014-09-21 LAB — IRON AND TIBC
%SAT: 12 % — ABNORMAL LOW (ref 20–55)
Iron: 47 ug/dL (ref 42–145)
TIBC: 403 ug/dL (ref 250–470)
UIBC: 356 ug/dL (ref 125–400)

## 2014-09-21 LAB — FOLATE: Folate: 10.4 ng/mL

## 2014-09-21 LAB — RETICULOCYTES
ABS Retic: 50.9 10*3/uL (ref 19.0–186.0)
RBC.: 4.24 MIL/uL (ref 3.87–5.11)
Retic Ct Pct: 1.2 % (ref 0.4–2.3)

## 2014-09-21 LAB — FERRITIN: Ferritin: 20 ng/mL (ref 10–291)

## 2014-09-21 LAB — VITAMIN B12: Vitamin B-12: 476 pg/mL (ref 211–911)

## 2014-09-21 MED ORDER — EXENATIDE 5 MCG/0.02ML ~~LOC~~ SOPN: PEN_INJECTOR | SUBCUTANEOUS | Status: DC

## 2014-09-21 MED ORDER — PHENTERMINE HCL 37.5 MG PO TABS: ORAL_TABLET | ORAL | Status: DC

## 2014-09-21 NOTE — Assessment & Plan Note (Signed)
Good continued weight loss, at the lowest weight she has ever been. Refilling phentermine as we enter the fourth month, unable to afford Saxenda, we are going to try switching to Victoza.

## 2014-09-21 NOTE — Assessment & Plan Note (Addendum)
Missed appts with hematology. Doing workup myself. Adding oral iron.

## 2014-09-21 NOTE — Progress Notes (Signed)
  Subjective:    CC: follow-up  HPI: Obesity: Good continued weight loss, she is currently at the lowest weight she has ever been, she was unable to afford Saxenda and is amenable to switch to a different injectable agent.  Pica: Tells me she eats a lot of ice, on further questioning she tells me she has had chronic anemia but has missed appointments with hematology, she tells me that not much of a workup has been done, and is amenable to me looking into treating her.Has significant fatigue but no fevers, chills, night sweats.  Past medical history, Surgical history, Family history not pertinant except as noted below, Social history, Allergies, and medications have been entered into the medical record, reviewed, and no changes needed.   Review of Systems: No fevers, chills, night sweats, weight loss, chest pain, or shortness of breath.   Objective:    General: Well Developed, well nourished, and in no acute distress.  Neuro: Alert and oriented x3, extra-ocular muscles intact, sensation grossly intact.  HEENT: Normocephalic, atraumatic, pupils equal round reactive to light, neck supple, no masses, no lymphadenopathy, thyroid nonpalpable.  Skin: Warm and dry, no rashes. Cardiac: Regular rate and rhythm, no murmurs rubs or gallops, no lower extremity edema.  Respiratory: Clear to auscultation bilaterally. Not using accessory muscles, speaking in full sentences.  Impression and Recommendations:    I spent 25 minutes with this patient, greater than 50% was face-to-face time counseling regarding the above diagnoses

## 2014-09-22 ENCOUNTER — Other Ambulatory Visit: Payer: Self-pay | Admitting: *Deleted

## 2014-09-22 LAB — PATHOLOGIST SMEAR REVIEW

## 2014-09-22 MED ORDER — AMBULATORY NON FORMULARY MEDICATION: Status: DC

## 2014-09-23 LAB — HEAVY METALS, BLOOD
Arsenic: <3 mcg/L (ref <23)
Lead: <2 ug/dL (ref <10)
Mercury, B: <4 mcg/L (ref <=10)

## 2014-09-28 MED ORDER — FUSION PLUS PO CAPS: 1.0000 | ORAL_CAPSULE | Freq: Every day | ORAL | Status: DC

## 2014-09-28 NOTE — Addendum Note (Signed)
Addended by: Silverio Decamp on: 09/28/2014 01:14 PM   Modules accepted: Orders

## 2014-10-19 ENCOUNTER — Telehealth: Payer: Self-pay

## 2014-10-19 ENCOUNTER — Encounter: Payer: Self-pay | Admitting: Sports Medicine

## 2014-10-19 ENCOUNTER — Ambulatory Visit (INDEPENDENT_AMBULATORY_CARE_PROVIDER_SITE_OTHER): Payer: BLUE CROSS/BLUE SHIELD | Admitting: Sports Medicine

## 2014-10-19 VITALS — BP 121/70 | HR 85 | Ht 63.0 in | Wt 207.0 lb

## 2014-10-19 DIAGNOSIS — D649 Anemia, unspecified: Secondary | ICD-10-CM

## 2014-10-19 DIAGNOSIS — Z0000 Encounter for general adult medical examination without abnormal findings: Secondary | ICD-10-CM

## 2014-10-19 DIAGNOSIS — E669 Obesity, unspecified: Secondary | ICD-10-CM | POA: Diagnosis not present

## 2014-10-19 MED ORDER — PHENTERMINE HCL 37.5 MG PO TABS: ORAL_TABLET | ORAL | Status: DC

## 2014-10-19 NOTE — Assessment & Plan Note (Signed)
Patient will return in a 15 minute slot to fill out a physical form, we do not need 30 minutes for this. Referral to OB/GYN for cervical cancer screening, she has a history of cervical cancer post conization.

## 2014-10-19 NOTE — Assessment & Plan Note (Signed)
Good continue weight loss, refilling phentermine as we enter the fifth month. She is also currently doing Byetta injections, 10 mg daily. We did her injection today in the office, she is getting some abdominal cramping which is likely due to the mechanism. Return in one month.

## 2014-10-19 NOTE — Assessment & Plan Note (Signed)
Persistent anemia, microcytic, hypochromic on peripheral smear, low iron saturation, elevated binding capacity, negative heavy metal studies, has not started her iron supplementation.

## 2014-10-19 NOTE — Telephone Encounter (Signed)
Patient request something called in for nausea sent to her pharmacy, she stated that she is having nausea from her Byetta. Azizah Lisle,CMA

## 2014-10-19 NOTE — Progress Notes (Signed)
  Subjective:    CC: follow-up  HPI: [name] returns, she has been seeing me for weight loss, obesity, we have finished 4 months of phentermine, and she has been doing intermittent Byetta injections. She continues to lose weight. Gets a bit of abdominal cramping, likely from the mechanism of Byetta.  Normocytic anemia: Noted iron deficiency on various anemia tests, has not yet started her oral iron.  Past medical history, Surgical history, Family history not pertinant except as noted below, Social history, Allergies, and medications have been entered into the medical record, reviewed, and no changes needed.   Review of Systems: No fevers, chills, night sweats, weight loss, chest pain, or shortness of breath.   Objective:    General: Well Developed, well nourished, and in no acute distress.  Neuro: Alert and oriented x3, extra-ocular muscles intact, sensation grossly intact.  HEENT: Normocephalic, atraumatic, pupils equal round reactive to light, neck supple, no masses, no lymphadenopathy, thyroid nonpalpable.  Skin: Warm and dry, no rashes. Cardiac: Regular rate and rhythm, no murmurs rubs or gallops, no lower extremity edema.  Respiratory: Clear to auscultation bilaterally. Not using accessory muscles, speaking in full sentences.  Impression and Recommendations:    I spent 25 minutes with this patient, greater than 50% was face-to-face time counseling regarding the above diagnoses

## 2014-10-20 MED ORDER — ONDANSETRON 8 MG PO TBDP: 8.0000 mg | ORAL_TABLET | Freq: Three times a day (TID) | ORAL | Status: DC | PRN

## 2014-10-20 NOTE — Telephone Encounter (Signed)
Zofran called in

## 2014-11-03 ENCOUNTER — Other Ambulatory Visit: Payer: Self-pay

## 2014-11-03 DIAGNOSIS — D649 Anemia, unspecified: Secondary | ICD-10-CM

## 2014-11-03 MED ORDER — HYDROCHLOROTHIAZIDE 25 MG PO TABS: 25.0000 mg | ORAL_TABLET | Freq: Every day | ORAL | Status: DC

## 2014-11-03 MED ORDER — FUSION PLUS PO CAPS: 1.0000 | ORAL_CAPSULE | Freq: Every day | ORAL | Status: DC

## 2014-11-17 ENCOUNTER — Encounter: Payer: Self-pay | Admitting: Sports Medicine

## 2014-11-17 ENCOUNTER — Ambulatory Visit (INDEPENDENT_AMBULATORY_CARE_PROVIDER_SITE_OTHER): Payer: BLUE CROSS/BLUE SHIELD | Admitting: Sports Medicine

## 2014-11-17 ENCOUNTER — Ambulatory Visit (INDEPENDENT_AMBULATORY_CARE_PROVIDER_SITE_OTHER): Payer: BLUE CROSS/BLUE SHIELD

## 2014-11-17 ENCOUNTER — Ambulatory Visit (HOSPITAL_BASED_OUTPATIENT_CLINIC_OR_DEPARTMENT_OTHER)
Admission: RE | Admit: 2014-11-17 | Discharge: 2014-11-17 | Disposition: A | Discharge status: Home / Self Care | Payer: BLUE CROSS/BLUE SHIELD | Source: Ambulatory Visit | Attending: Sports Medicine | Admitting: Sports Medicine

## 2014-11-17 ENCOUNTER — Encounter (HOSPITAL_BASED_OUTPATIENT_CLINIC_OR_DEPARTMENT_OTHER): Payer: Self-pay

## 2014-11-17 VITALS — BP 129/82 | HR 90 | Wt 203.0 lb

## 2014-11-17 DIAGNOSIS — E669 Obesity, unspecified: Secondary | ICD-10-CM

## 2014-11-17 DIAGNOSIS — R7611 Nonspecific reaction to tuberculin skin test without active tuberculosis: Secondary | ICD-10-CM | POA: Diagnosis present

## 2014-11-17 DIAGNOSIS — R911 Solitary pulmonary nodule: Secondary | ICD-10-CM

## 2014-11-17 DIAGNOSIS — D649 Anemia, unspecified: Secondary | ICD-10-CM

## 2014-11-17 DIAGNOSIS — J984 Other disorders of lung: Secondary | ICD-10-CM | POA: Insufficient documentation

## 2014-11-17 DIAGNOSIS — Z227 Latent tuberculosis: Secondary | ICD-10-CM | POA: Insufficient documentation

## 2014-11-17 LAB — CBC
HCT: 32.7 % — ABNORMAL LOW (ref 36.0–46.0)
Hemoglobin: 10.9 g/dL — ABNORMAL LOW (ref 12.0–15.0)
MCH: 27.9 pg (ref 26.0–34.0)
MCHC: 33.3 g/dL (ref 30.0–36.0)
MCV: 83.8 fL (ref 78.0–100.0)
MPV: 10 fL (ref 8.6–12.4)
Platelets: 374 10*3/uL (ref 150–400)
RBC: 3.9 MIL/uL (ref 3.87–5.11)
RDW: 15.5 % (ref 11.5–15.5)
WBC: 10.7 10*3/uL — ABNORMAL HIGH (ref 4.0–10.5)

## 2014-11-17 MED ORDER — PHENTERMINE HCL 37.5 MG PO TABS: ORAL_TABLET | ORAL | Status: DC

## 2014-11-17 MED ORDER — IOHEXOL 300 MG/ML  SOLN
75.0000 mL | Freq: Once | INTRAMUSCULAR | Status: AC | PRN
Start: 2014-11-17 — End: 2014-11-17
  Administered 2014-11-17: 75 mL via INTRAVENOUS

## 2014-11-17 NOTE — Assessment & Plan Note (Signed)
Weight loss after 5 months on phentermine. We are going to get back on Byetta. Zofran will be taken with injections. Return in one month for a weight check.

## 2014-11-17 NOTE — Assessment & Plan Note (Signed)
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 4 months of isoniazid. Today we will get a Quantiferon Gold and a chest x-ray, she will be cleared after that.

## 2014-11-17 NOTE — Assessment & Plan Note (Signed)
On iron for 1-2 weeks now. Rechecking, anemia panel, CBC.

## 2014-11-17 NOTE — Progress Notes (Signed)
  Subjective:    CC: Follow-up  HPI: Obesity: 5 pound weight loss on phentermine, has not been doing Byetta, she stopped it after a stable dose with some nausea. She was counseled on this today.  Latent tuberculosis: Treated for 4 months by the health department with isoniazid, she did have a negative chest x-ray, she does need a recheck, she is going to a health related profession/respiratory therapy.  Iron deficiency anemia, microcytic, hypochromic: Has done about a week and a half to 2 weeks of iron, since we are checking blood which she is amenable to recheck her iron today.  Past medical history, Surgical history, Family history not pertinant except as noted below, Social history, Allergies, and medications have been entered into the medical record, reviewed, and no changes needed.   Review of Systems: No fevers, chills, night sweats, weight loss, chest pain, or shortness of breath.   Objective:    General: Well Developed, well nourished, and in no acute distress.  Neuro: Alert and oriented x3, extra-ocular muscles intact, sensation grossly intact.  HEENT: Normocephalic, atraumatic, pupils equal round reactive to light, neck supple, no masses, no lymphadenopathy, thyroid nonpalpable.  Skin: Warm and dry, no rashes. Cardiac: Regular rate and rhythm, no murmurs rubs or gallops, no lower extremity edema.  Respiratory: Clear to auscultation bilaterally. Not using accessory muscles, speaking in full sentences.  Impression and Recommendations:

## 2014-11-18 LAB — QUANTIFERON TB GOLD ASSAY (BLOOD)
Interferon Gamma Release Assay: NEGATIVE
Mitogen value: 8.69 IU/mL
Quantiferon Nil Value: 0.03 [IU]/mL
Quantiferon Tb Ag Minus Nil Value: 0.01 [IU]/mL
TB Ag value: 0.04 IU/mL

## 2014-11-18 LAB — VITAMIN B12: Vitamin B-12: 693 pg/mL (ref 211–911)

## 2014-11-18 LAB — FOLATE: Folate: >20 ng/mL

## 2014-11-18 LAB — IRON AND TIBC
%SAT: 28 % (ref 11–50)
Iron: 103 ug/dL (ref 40–190)
TIBC: 369 ug/dL (ref 250–450)
UIBC: 266 ug/dL (ref 125–400)

## 2014-11-18 LAB — FERRITIN: Ferritin: 20 ng/mL (ref 10–291)

## 2014-12-12 ENCOUNTER — Encounter: Payer: Self-pay | Admitting: Sports Medicine

## 2014-12-12 ENCOUNTER — Ambulatory Visit (INDEPENDENT_AMBULATORY_CARE_PROVIDER_SITE_OTHER): Payer: BLUE CROSS/BLUE SHIELD | Admitting: Sports Medicine

## 2014-12-12 VITALS — BP 150/79 | HR 89 | Ht 63.0 in | Wt 199.0 lb

## 2014-12-12 DIAGNOSIS — N2889 Other specified disorders of kidney and ureter: Secondary | ICD-10-CM | POA: Insufficient documentation

## 2014-12-12 DIAGNOSIS — E669 Obesity, unspecified: Secondary | ICD-10-CM

## 2014-12-12 DIAGNOSIS — D649 Anemia, unspecified: Secondary | ICD-10-CM | POA: Diagnosis not present

## 2014-12-12 MED ORDER — EXENATIDE 5 MCG/0.02ML ~~LOC~~ SOPN: PEN_INJECTOR | SUBCUTANEOUS | Status: DC

## 2014-12-12 MED ORDER — BUPROPION HCL ER (XL) 300 MG PO TB24: 300.0000 mg | ORAL_TABLET | Freq: Every day | ORAL | Status: DC

## 2014-12-12 MED ORDER — PHENTERMINE HCL 37.5 MG PO TABS: ORAL_TABLET | ORAL | Status: DC

## 2014-12-12 NOTE — Assessment & Plan Note (Signed)
Discontinuing iron supplementation. Referral to hematology for consideration of iron infusion therapy.

## 2014-12-12 NOTE — Assessment & Plan Note (Signed)
Per CT report at [facility] there is a well marginated round 3 similar exophytic mass along the left lower kidney consistent with carcinoma, there was no adenopathy in the renal hilum or the left para-aortic station and no renal vein thrombosis. Immediate referral to urology.

## 2014-12-12 NOTE — Assessment & Plan Note (Signed)
Refilling Byetta and phentermine. We're entering the fifth month.

## 2014-12-12 NOTE — Progress Notes (Signed)
  Subjective:    CC: follow-up  HPI: Weight check: Mild continued weight loss, currently using Byetta and phentermine.  Renal mass: Went to the emergency room with abdominal pain after using iron oral, incidentally noted was an exophytic mass on the left kidney.  Iron deficiency anemia: Has been difficult to treat, patient has not been able to tolerate oral iron supplementation, amenable to proceed with infusion therapy.  Past medical history, Surgical history, Family history not pertinant except as noted below, Social history, Allergies, and medications have been entered into the medical record, reviewed, and no changes needed.   Review of Systems: No fevers, chills, night sweats, weight loss, chest pain, or shortness of breath.   Objective:    General: Well Developed, well nourished, and in no acute distress.  Neuro: Alert and oriented x3, extra-ocular muscles intact, sensation grossly intact.  HEENT: Normocephalic, atraumatic, pupils equal round reactive to light, neck supple, no masses, no lymphadenopathy, thyroid nonpalpable.  Skin: Warm and dry, no rashes. Cardiac: Regular rate and rhythm, no murmurs rubs or gallops, no lower extremity edema.  Respiratory: Clear to auscultation bilaterally. Not using accessory muscles, speaking in full sentences.  Impression and Recommendations:

## 2014-12-15 ENCOUNTER — Ambulatory Visit: Payer: BLUE CROSS/BLUE SHIELD | Admitting: Sports Medicine

## 2014-12-21 ENCOUNTER — Telehealth: Payer: Self-pay | Admitting: Sports Medicine

## 2014-12-21 NOTE — Telephone Encounter (Signed)
Attempted to move appointment, no other available appointments, she has been placed on the wait list.

## 2015-01-09 ENCOUNTER — Ambulatory Visit (INDEPENDENT_AMBULATORY_CARE_PROVIDER_SITE_OTHER): Payer: BLUE CROSS/BLUE SHIELD | Admitting: Sports Medicine

## 2015-01-09 ENCOUNTER — Encounter: Payer: Self-pay | Admitting: Sports Medicine

## 2015-01-09 VITALS — BP 126/68 | HR 93 | Ht 63.0 in | Wt 203.0 lb

## 2015-01-09 DIAGNOSIS — E669 Obesity, unspecified: Secondary | ICD-10-CM

## 2015-01-09 DIAGNOSIS — N2889 Other specified disorders of kidney and ureter: Secondary | ICD-10-CM | POA: Diagnosis not present

## 2015-01-09 MED ORDER — PHENTERMINE HCL 37.5 MG PO TABS: ORAL_TABLET | ORAL | Status: DC

## 2015-01-09 MED ORDER — TOPIRAMATE 50 MG PO TABS: ORAL_TABLET | ORAL | Status: DC

## 2015-01-09 NOTE — Assessment & Plan Note (Signed)
Continue Byetta and phentermine, weight has plateaued however she did skip several days of phentermine due to her father being in the hospital. Refilling phentermine and adding Topamax. Return in one month, we are entering the sixth month.

## 2015-01-09 NOTE — Assessment & Plan Note (Signed)
Appt with urology as tomorrow.

## 2015-01-09 NOTE — Progress Notes (Signed)
  Subjective:    CC: Weight check  HPI: [name] returns, she has been fairly intermittent with her phentermine over the last month, her father was hospitalized for several days. Not presently she has gained a bit of weight. She is also currently using Byetta.  Past medical history, Surgical history, Family history not pertinant except as noted below, Social history, Allergies, and medications have been entered into the medical record, reviewed, and no changes needed.   Review of Systems: No fevers, chills, night sweats, weight loss, chest pain, or shortness of breath.   Objective:    General: Well Developed, well nourished, and in no acute distress.  Neuro: Alert and oriented x3, extra-ocular muscles intact, sensation grossly intact.  HEENT: Normocephalic, atraumatic, pupils equal round reactive to light, neck supple, no masses, no lymphadenopathy, thyroid nonpalpable.  Skin: Warm and dry, no rashes. Cardiac: Regular rate and rhythm, no murmurs rubs or gallops, no lower extremity edema.  Respiratory: Clear to auscultation bilaterally. Not using accessory muscles, speaking in full sentences.  Impression and Recommendations:

## 2015-01-10 ENCOUNTER — Ambulatory Visit (HOSPITAL_COMMUNITY)
Admission: RE | Admit: 2015-01-10 | Discharge: 2015-01-10 | Disposition: A | Discharge status: Home / Self Care | Payer: BLUE CROSS/BLUE SHIELD | Source: Ambulatory Visit | Attending: Urology | Admitting: Urology

## 2015-01-10 ENCOUNTER — Other Ambulatory Visit (HOSPITAL_COMMUNITY): Payer: Self-pay | Admitting: Urology

## 2015-01-10 DIAGNOSIS — D49512 Neoplasm of unspecified behavior of left kidney: Secondary | ICD-10-CM

## 2015-01-10 DIAGNOSIS — D4102 Neoplasm of uncertain behavior of left kidney: Secondary | ICD-10-CM

## 2015-01-10 DIAGNOSIS — N2889 Other specified disorders of kidney and ureter: Secondary | ICD-10-CM | POA: Insufficient documentation

## 2015-01-10 DIAGNOSIS — Z01818 Encounter for other preprocedural examination: Secondary | ICD-10-CM | POA: Insufficient documentation

## 2015-01-11 ENCOUNTER — Other Ambulatory Visit: Payer: Self-pay | Admitting: Urology

## 2015-01-12 ENCOUNTER — Encounter: Payer: Self-pay | Admitting: Sports Medicine

## 2015-01-17 ENCOUNTER — Ambulatory Visit (HOSPITAL_BASED_OUTPATIENT_CLINIC_OR_DEPARTMENT_OTHER): Payer: BLUE CROSS/BLUE SHIELD | Admitting: Family

## 2015-01-17 ENCOUNTER — Telehealth: Payer: Self-pay | Admitting: Hematology & Oncology

## 2015-01-17 ENCOUNTER — Ambulatory Visit: Payer: BLUE CROSS/BLUE SHIELD

## 2015-01-17 ENCOUNTER — Other Ambulatory Visit (HOSPITAL_BASED_OUTPATIENT_CLINIC_OR_DEPARTMENT_OTHER): Payer: BLUE CROSS/BLUE SHIELD

## 2015-01-17 ENCOUNTER — Encounter: Payer: Self-pay | Admitting: Family

## 2015-01-17 ENCOUNTER — Other Ambulatory Visit: Payer: Self-pay | Admitting: Family

## 2015-01-17 VITALS — BP 140/82 | HR 83 | Temp 97.4°F | Wt 195.0 lb

## 2015-01-17 DIAGNOSIS — N289 Disorder of kidney and ureter, unspecified: Secondary | ICD-10-CM | POA: Diagnosis not present

## 2015-01-17 DIAGNOSIS — D649 Anemia, unspecified: Secondary | ICD-10-CM

## 2015-01-17 DIAGNOSIS — D509 Iron deficiency anemia, unspecified: Secondary | ICD-10-CM

## 2015-01-17 LAB — IRON AND TIBC CHCC
%SAT: 9 % — ABNORMAL LOW (ref 21–57)
Iron: 30 ug/dL — ABNORMAL LOW (ref 41–142)
TIBC: 348 ug/dL (ref 236–444)
UIBC: 317 ug/dL (ref 120–384)

## 2015-01-17 LAB — CBC WITH DIFFERENTIAL (CANCER CENTER ONLY)
BASO#: 0.1 10*3/uL (ref 0.0–0.2)
BASO%: 0.5 % (ref 0.0–2.0)
EOS ABS: 0.5 10*3/uL (ref 0.0–0.5)
EOS%: 3.5 % (ref 0.0–7.0)
HCT: 35 % (ref 34.8–46.6)
HGB: 11.5 g/dL — ABNORMAL LOW (ref 11.6–15.9)
LYMPH#: 3.3 10*3/uL (ref 0.9–3.3)
LYMPH%: 25.5 % (ref 14.0–48.0)
MCH: 28.8 pg (ref 26.0–34.0)
MCHC: 32.9 g/dL (ref 32.0–36.0)
MCV: 88 fL (ref 81–101)
MONO#: 0.7 10*3/uL (ref 0.1–0.9)
MONO%: 5.1 % (ref 0.0–13.0)
NEUT#: 8.4 10*3/uL — ABNORMAL HIGH (ref 1.5–6.5)
NEUT%: 65.4 % (ref 39.6–80.0)
PLATELETS: 375 10*3/uL (ref 145–400)
RBC: 3.99 10*6/uL (ref 3.70–5.32)
RDW: 14.3 % (ref 11.1–15.7)
WBC: 12.8 10*3/uL — AB (ref 3.9–10.0)

## 2015-01-17 LAB — CHCC SATELLITE - SMEAR

## 2015-01-17 LAB — FERRITIN CHCC: Ferritin: 35 ng/ml (ref 9–269)

## 2015-01-17 NOTE — Telephone Encounter (Signed)
[place]   I spoke w [name] today @ [phone].  Ref: [id number]   [id number] N98.9

## 2015-01-17 NOTE — Progress Notes (Signed)
Hematology/Oncology Consultation   Name: [name]      MRN: [id number]    Location: Room/bed info not found  Date: 01/17/2015 Time:10:56 AM   REFERRING PHYSICIAN: Gwen Her. Thekkekandam   REASON FOR CONSULT: Normocytic anemia    DIAGNOSIS:  1. Iron deficiency anemia  2. Left renal mass  HISTORY OF PRESENT ILLNESS: [name] is a very pleasant 49 yo white female with history of anemia. She has been taking an oral supplement but this causes her a lot of GI issues. She is symptomatic with eating ice, fatigue and palpitations.  After going to the ED with abdominal pain after taking an iron supplement she was found to have a 3 cm mass on her lower left kidney. She is now followed by urologist Dr. Alinda Money who feels that her mass is malignant. She will be having a repeat CT scan on November 8th and then having surgery to remove the mass from her left kidney on December 1st.  She is having some left flank pain due to the mass. Her urine has been positive for blood.  She has had some swelling in her legs and has been taking hydrochlorothiazide which helps.  She has been taking Byetta and Adipex-P to help with weight loss. She is currently losing weight but states that this is due to the medication and her diet. She has a history of cervical cancer and had a cone biopsy. So far, this has not recurred. She has never had a mammogram and states that right now she does not want one. We discussed the importance of doing self breast exams and proper technique. She verbalized understanding.   She had both an endoscopy and colonoscopy on October 11th to evaluate for possible cause if anemia. These were normal.  Family cancer history includes a cousin with stomach cancer and also grandfather and uncle both passed away from cancer, types were unknown.  Her mother and both of her sisters are anemic. Her mother has received iron infusions periodically. [name] has never received IV iron.  Her cycles are  irregular at times and not heavy. No clots.  She has numbness and tingling in her hands and feet at times. She denies fever, chills, n/v, cough, rash, dizziness, SOB, chest pain, abdominal pain, changes in bowel habits. She has had no blood in her stool.  She is eating well and staying hydrated.  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  She has never smoked  And does not drink ETOH. Her husband is a heavy smoker so she does get some second hand.   ROS: All other 10 point review of systems is negative.   PAST MEDICAL HISTORY:   Past Medical History  Diagnosis Date  . Hypertension     ALLERGIES: Allergies  Allergen Reactions  . Codeine Nausea And Vomiting  . Hydromorphone Hcl Other (See Comments)      MEDICATIONS:  Current Outpatient Prescriptions on File Prior to Visit  Medication Sig Dispense Refill  . AMBULATORY NON FORMULARY MEDICATION Needles for Byetta pen Dx: 30 each 0  . buPROPion (WELLBUTRIN XL) 300 MG 24 hr tablet Take 1 tablet (300 mg total) by mouth daily. 90 tablet 3  . exenatide (BYETTA 5 MCG PEN) 5 MCG/0.02ML SOPN injection 10 g twice daily. 2 pen 3  . hydrochlorothiazide (HYDRODIURIL) 25 MG tablet Take 1 tablet (25 mg total) by mouth daily. 30 tablet 5  . meloxicam (MOBIC) 15 MG tablet Take 1 tablet (  15 mg total) by mouth daily. 90 tablet 3  . phentermine (ADIPEX-P) 37.5 MG tablet One half tab by mouth twice a day 30 tablet 0  . topiramate (TOPAMAX) 50 MG tablet One half tab by mouth daily for a week, then one tab by mouth daily. 30 tablet 0   No current facility-administered medications on file prior to visit.     PAST SURGICAL HISTORY Past Surgical History  Procedure Laterality Date  . Nasal sinus surgery    . Carpal tunnel release    . Heel spur surgery    . Cholecystectomy      FAMILY HISTORY: Family History  Problem Relation Age of Onset  . Heart disease Mother   . Heart disease Father     SOCIAL HISTORY:  reports  that she has never smoked. She has never used smokeless tobacco. She reports that she does not drink alcohol or use illicit drugs.  PERFORMANCE STATUS: The patient's performance status is 1 - Symptomatic but completely ambulatory  PHYSICAL EXAM: Most Recent Vital Signs: Blood pressure 140/82, pulse 83, temperature 97.4 F (36.3 C), temperature source Oral, weight 195 lb (88.451 kg), last menstrual period 12/13/2014. BP 140/82 mmHg  Pulse 83  Temp(Src) 97.4 F (36.3 C) (Oral)  Wt 195 lb (88.451 kg)  LMP 12/13/2014  General Appearance:    Alert, cooperative, no distress, appears stated age  Head:    Normocephalic, without obvious abnormality, atraumatic  Eyes:    PERRL, conjunctiva/corneas clear, EOM's intact, fundi    benign, both eyes        Throat:   Lips, mucosa, and tongue normal; teeth and gums normal  Neck:   Supple, symmetrical, trachea midline, no adenopathy;    thyroid:  no enlargement/tenderness/nodules; no carotid   bruit or JVD  Back:     Symmetric, no curvature, ROM normal, no CVA tenderness, has tenderness in left flank (retroperitoneal) due to kidney mass.   Lungs:     Clear to auscultation bilaterally, respirations unlabored  Chest Wall:    No tenderness or deformity   Heart:    Regular rate and rhythm, S1 and S2 normal, no murmur, rub   or gallop  Breast Exam:    No tenderness, masses, or nipple abnormality  Abdomen:     Soft, non-tender, bowel sounds active all four quadrants,    no masses, no organomegaly        Extremities:   Extremities normal, atraumatic, no cyanosis or edema  Pulses:   2+ and symmetric all extremities  Skin:   Skin color, texture, turgor normal, no rashes or lesions  Lymph nodes:   Cervical, supraclavicular, and axillary nodes normal  Neurologic:   CNII-XII intact, normal strength, sensation and reflexes    throughout    LABORATORY DATA:  No results found for this or any previous visit (from the past 48 hour(s)).    RADIOGRAPHY: No  results found.     PATHOLOGY: None   ASSESSMENT/PLAN: [name] is a very pleasant 49 yo white female with history of anemia. She is symptomatic with eating ice, fatigue and palpitations. Her iron saturation at this time is 9% with a ferritin of 35.  We will give her a dose of Feraheme on Friday and again in 8 days. While in the ED with abdominal pain after taking an iron supplement she was found to have a 3 cm mass on her lower left kidney. As mentioned above, she is followed by Dr. Alinda Money with urology. He  feels that her mass is malignant and has planned for a repeat Ct scan on November 8th and removal of the mass on December 1st.  We will plan to see her back the week before Christmas. At that point we should have all the pathology reports back on the mas and have a plan on how to proceed with her care,   All questions were answered. She will contact us with any problems, questions or concerns. We can certainly see her much sooner if necessary.  She was discussed with and also seen by Dr. Marin Olp and he is in agreement with the aforementioned.   [facility]    Addendum:  I saw and examined the patient with Kerem Gilmer.  We looked at her blood smear and it was c/w iron deficiency.  Her iron studies included show iron deficiency. Her ferritin is 35 with an iron saturation of 9%.  Of note, her erythropoietin level is 15.2.  We will go ahead and give her some Feraheme. We will give her 2 doses.  Also of an issue is the mass in the left kidney. I spoke with Dr. Alinda Money. He is fairly convinced that this is malignant. It sounds as if this is contained. A partial nephrectomy is going to be done with robotic assistance. We will await the path report. If this is a primary renal cell carcinoma, there is no indication for any type of adjuvant therapy as of now.  She is very nice. She is in great shape. I think she will get through surgery without any problems. 1 make sure that her blood count is  optimized for surgery. I think replenishing her iron supplies will compresses.  We spent about 45 minutes with her. We reviewed her labs. We went over the reason for giving IV iron.  We will plan to see her back in about 6 weeks. She will got through her surgery.  Laurey Arrow

## 2015-01-19 ENCOUNTER — Ambulatory Visit (HOSPITAL_BASED_OUTPATIENT_CLINIC_OR_DEPARTMENT_OTHER): Payer: BLUE CROSS/BLUE SHIELD

## 2015-01-19 VITALS — BP 128/82 | HR 77 | Temp 98.2°F | Resp 18

## 2015-01-19 DIAGNOSIS — D649 Anemia, unspecified: Secondary | ICD-10-CM | POA: Diagnosis not present

## 2015-01-19 LAB — ERYTHROPOIETIN: ERYTHROPOIETIN: 15.2 m[IU]/mL (ref 2.6–18.5)

## 2015-01-19 LAB — RETICULOCYTES (CHCC)
ABS Retic: 48.7 10*3/uL (ref 19.0–186.0)
RBC.: 4.06 MIL/uL (ref 3.87–5.11)
RETIC CT PCT: 1.2 % (ref 0.4–2.3)

## 2015-01-19 MED ORDER — SODIUM CHLORIDE 0.9 % IV SOLN
510.0000 mg | Freq: Once | INTRAVENOUS | Status: AC
  Administered 2015-01-19: 510 mg via INTRAVENOUS
  Filled 2015-01-19: qty 17

## 2015-01-19 NOTE — Patient Instructions (Signed)

## 2015-01-26 ENCOUNTER — Other Ambulatory Visit: Payer: Self-pay | Admitting: Family

## 2015-01-26 ENCOUNTER — Ambulatory Visit (HOSPITAL_BASED_OUTPATIENT_CLINIC_OR_DEPARTMENT_OTHER): Payer: BLUE CROSS/BLUE SHIELD

## 2015-01-26 VITALS — BP 133/69 | HR 84 | Temp 98.1°F | Resp 16

## 2015-01-26 DIAGNOSIS — D649 Anemia, unspecified: Secondary | ICD-10-CM

## 2015-01-26 DIAGNOSIS — D509 Iron deficiency anemia, unspecified: Secondary | ICD-10-CM

## 2015-01-26 MED ORDER — FERUMOXYTOL INJECTION 510 MG/17 ML
510.0000 mg | Freq: Once | INTRAVENOUS | Status: AC
  Administered 2015-01-26: 510 mg via INTRAVENOUS
  Filled 2015-01-26: qty 17

## 2015-01-26 MED ORDER — SODIUM CHLORIDE 0.9 % IV SOLN
INTRAVENOUS | Status: DC
  Administered 2015-01-26: 11:00:00 via INTRAVENOUS

## 2015-01-26 NOTE — Patient Instructions (Signed)

## 2015-02-06 ENCOUNTER — Ambulatory Visit (INDEPENDENT_AMBULATORY_CARE_PROVIDER_SITE_OTHER): Payer: BLUE CROSS/BLUE SHIELD | Admitting: Sports Medicine

## 2015-02-06 ENCOUNTER — Encounter: Payer: Self-pay | Admitting: Sports Medicine

## 2015-02-06 VITALS — BP 147/94 | HR 90 | Wt 194.0 lb

## 2015-02-06 DIAGNOSIS — E669 Obesity, unspecified: Secondary | ICD-10-CM

## 2015-02-06 DIAGNOSIS — N2889 Other specified disorders of kidney and ureter: Secondary | ICD-10-CM | POA: Diagnosis not present

## 2015-02-06 NOTE — Progress Notes (Signed)
  Subjective:    CC: Follow-up  HPI: Obesity: Good weight loss after the last month, we did add Topamax. Currently on Byetta, Topamax, phentermine.  Renal mass: Left-sided, partial nephrectomy scheduled for next week, this is suspected to be a kidney cancer.  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 Only having mild paresthesias over the tip of the index finger.  Past medical history, Surgical history, Family history not pertinant except as noted below, Social history, Allergies, and medications have been entered into the medical record, reviewed, and no changes needed.   Review of Systems: No fevers, chills, night sweats, weight loss, chest pain, or shortness of breath.   Objective:    General: Well Developed, well nourished, and in no acute distress.  Neuro: Alert and oriented x3, extra-ocular muscles intact, sensation grossly intact.  HEENT: Normocephalic, atraumatic, pupils equal round reactive to light, neck supple, no masses, no lymphadenopathy, thyroid nonpalpable.  Skin: Warm and dry, no rashes. Cardiac: Regular rate and rhythm, no murmurs rubs or gallops, no lower extremity edema.  Respiratory: Clear to auscultation bilaterally. Not using accessory muscles, speaking in full sentences.  Impression and Recommendations:    I spent 25 minutes with this patient, greater than 50% was face-to-face time counseling regarding the above diagnoses

## 2015-02-06 NOTE — Assessment & Plan Note (Signed)
We are going to put a hold on all weight loss medications for now. Her surgery is next week. When we restart we will probably use Bydureon instead of Byetta.

## 2015-02-06 NOTE — Assessment & Plan Note (Signed)
Partial nephrectomy scheduled for next week. Likely renal cell carcinoma

## 2015-02-13 ENCOUNTER — Encounter (HOSPITAL_COMMUNITY): Payer: Self-pay

## 2015-02-13 ENCOUNTER — Encounter (HOSPITAL_COMMUNITY)
Admission: RE | Admit: 2015-02-13 | Discharge: 2015-02-13 | Disposition: A | Discharge status: Home / Self Care | Payer: BLUE CROSS/BLUE SHIELD | Source: Ambulatory Visit | Attending: Urology | Admitting: Urology

## 2015-02-13 HISTORY — DX: Anemia, unspecified: D64.9

## 2015-02-13 HISTORY — DX: Pain in unspecified hip: M25.559

## 2015-02-13 HISTORY — DX: Other injury of unspecified body region, initial encounter: T14.8XXA

## 2015-02-13 HISTORY — DX: Nonspecific reaction to tuberculin skin test without active tuberculosis: R76.11

## 2015-02-13 HISTORY — DX: Hematuria, unspecified: R31.9

## 2015-02-13 LAB — CBC
HEMATOCRIT: 36 % (ref 36.0–46.0)
HEMOGLOBIN: 11.9 g/dL — AB (ref 12.0–15.0)
MCH: 30.3 pg (ref 26.0–34.0)
MCHC: 33.1 g/dL (ref 30.0–36.0)
MCV: 91.6 fL (ref 78.0–100.0)
Platelets: 303 10*3/uL (ref 150–400)
RBC: 3.93 MIL/uL (ref 3.87–5.11)
RDW: 15.4 % (ref 11.5–15.5)
WBC: 12.4 10*3/uL — AB (ref 4.0–10.5)

## 2015-02-13 LAB — BASIC METABOLIC PANEL
ANION GAP: 8 (ref 5–15)
BUN: 12 mg/dL (ref 6–20)
CHLORIDE: 103 mmol/L (ref 101–111)
CO2: 30 mmol/L (ref 22–32)
Calcium: 9.5 mg/dL (ref 8.9–10.3)
Creatinine, Ser: 0.83 mg/dL (ref 0.44–1.00)
Glucose, Bld: 93 mg/dL (ref 65–99)
POTASSIUM: 3.6 mmol/L (ref 3.5–5.1)
SODIUM: 141 mmol/L (ref 135–145)

## 2015-02-13 LAB — HCG, SERUM, QUALITATIVE: Preg, Serum: NEGATIVE

## 2015-02-13 LAB — ABO/RH: ABO/RH(D): A POS

## 2015-02-13 NOTE — Patient Instructions (Addendum)
[name]  02/13/2015   Your procedure is scheduled on:   02-15-2015 Thursday  Enter through [place] and follow signs to [facility] to [place]. Arrive at    0930    AM.  (Limit 1 person with you).  Call this number if you have problems the morning of surgery: [phone]  Or Presurgical Testing [phone] days before.   For Living Will and/or Health Care Power Attorney Forms: please provide copy for your medical record,may bring AM of surgery(Forms should be already notarized -we do not provide this service).( No information preferred today).  Remember: Follow any bowel prep instructions per MD offic   Do not eat food/ or drink: After Midnight.      Take these medicines the morning of surgery with A SIP OF WATER-   (DO NOT TAKE ANY DIABETIC MEDS AM OF SURGERY) : Bupropion.   Do not wear jewelry, make-up or nail polish.  Do not wear deodorant, lotions, powders, or perfumes.   Do not shave legs and under arms- 48 hours(2 days) prior to first CHG shower.(Shaving face and neck okay.)  Do not bring valuables to the hospital.(Hospital is not responsible for lost valuables).  Contacts, dentures or removable bridgework, body piercing, hair pins may not be worn into surgery.  Leave suitcase in the car. After surgery it may be brought to your room.  For patients admitted to the hospital, checkout time is 11:00 AM the day of discharge.(Restricted visitors-Any Persons displaying flu-like symptoms or illness).    Patients discharged the day of surgery will not be allowed to drive home. Must have responsible person with you x 24 hours once discharged.  Name and phone number of your driver: [name]- spouse [phone] cell     Please read over the following fact sheets that you were given:  CHG(Chlorhexidine Gluconate 4% Surgical Soap) use, MRSA Information, Blood Transfusion fact sheet, Incentive Spirometry Instruction.  Remember :  Type/Screen "Blue armbands" - may not be removed once applied(would result in being retested AM of surgery, if removed).         [name] - Preparing for Surgery Before surgery, you can play an important role.  Because skin is not sterile, your skin needs to be as free of germs as possible.  You can reduce the number of germs on your skin by washing with CHG (chlorahexidine gluconate) soap before surgery.  CHG is an antiseptic cleaner which kills germs and bonds with the skin to continue killing germs even after washing. Please DO NOT use if you have an allergy to CHG or antibacterial soaps.  If your skin becomes reddened/irritated stop using the CHG and inform your nurse when you arrive at Short Stay. Do not shave (including legs and underarms) for at least 48 hours prior to the first CHG shower.  You may shave your face/neck. Please follow these instructions carefully:  1.  Shower with CHG Soap the night before surgery and the  morning of Surgery.  2.  If you choose to wash your hair, wash your hair first as usual with your  normal  shampoo.  3.  After you shampoo, rinse your hair and body thoroughly to remove the  shampoo.                           4.  Use CHG as you would any other liquid soap.  You can  apply chg directly  to the skin and wash                       Gently with a scrungie or clean washcloth.  5.  Apply the CHG Soap to your body ONLY FROM THE NECK DOWN.   Do not use on face/ open                           Wound or open sores. Avoid contact with eyes, ears mouth and genitals (private parts).                       Wash face,  Genitals (private parts) with your normal soap.             6.  Wash thoroughly, paying special attention to the area where your surgery  will be performed.  7.  Thoroughly rinse your body with warm water from the neck down.  8.  DO NOT shower/wash with your normal soap after using and rinsing off  the CHG Soap.                9.  Pat yourself dry with a  clean towel.            10.  Wear clean pajamas.            11.  Place clean sheets on your bed the night of your first shower and do not  sleep with pets. Day of Surgery : Do not apply any lotions/deodorants the morning of surgery.  Please wear clean clothes to the hospital/surgery center.  FAILURE TO FOLLOW THESE INSTRUCTIONS MAY RESULT IN THE CANCELLATION OF YOUR SURGERY PATIENT SIGNATURE_________________________________  NURSE SIGNATURE__________________________________  ________________________________________________________________________   [name]  An incentive spirometer is a tool that can help keep your lungs clear and active. This tool measures how well you are filling your lungs with each breath. Taking long deep breaths may help reverse or decrease the chance of developing breathing (pulmonary) problems (especially infection) following:  A long period of time when you are unable to move or be active. BEFORE THE PROCEDURE   If the spirometer includes an indicator to show your best effort, your nurse or respiratory therapist will set it to a desired goal.  If possible, sit up straight or lean slightly forward. Try not to slouch.  Hold the incentive spirometer in an upright position. INSTRUCTIONS FOR USE   Sit on the edge of your bed if possible, or sit up as far as you can in bed or on a chair.  Hold the incentive spirometer in an upright position.  Breathe out normally.  Place the mouthpiece in your mouth and seal your lips tightly around it.  Breathe in slowly and as deeply as possible, raising the piston or the ball toward the top of the column.  Hold your breath for 3-5 seconds or for as long as possible. Allow the piston or ball to fall to the bottom of the column.  Remove the mouthpiece from your mouth and breathe out normally.  Rest for a few seconds and repeat Steps 1 through 7 at least 10 times every 1-2 hours when you are awake. Take your  time and take a few normal breaths between deep breaths.  The spirometer may include an indicator to show your best effort. Use the indicator as a goal to work toward during  each repetition.  After each set of 10 deep breaths, practice coughing to be sure your lungs are clear. If you have an incision (the cut made at the time of surgery), support your incision when coughing by placing a pillow or rolled up towels firmly against it. Once you are able to get out of bed, walk around indoors and cough well. You may stop using the incentive spirometer when instructed by your caregiver.  RISKS AND COMPLICATIONS  Take your time so you do not get dizzy or light-headed.  If you are in pain, you may need to take or ask for pain medication before doing incentive spirometry. It is harder to take a deep breath if you are having pain. AFTER USE  Rest and breathe slowly and easily.  It can be helpful to keep track of a log of your progress. Your caregiver can provide you with a simple table to help with this. If you are using the spirometer at home, follow these instructions: [place] IF:   You are having difficultly using the spirometer.  You have trouble using the spirometer as often as instructed.  Your pain medication is not giving enough relief while using the spirometer.  You develop fever of 100.5 F (38.1 C) or higher. SEEK IMMEDIATE MEDICAL CARE IF:   You cough up bloody sputum that had not been present before.  You develop fever of 102 F (38.9 C) or greater.  You develop worsening pain at or near the incision site. MAKE SURE YOU:   Understand these instructions.  Will watch your condition.  Will get help right away if you are not doing well or get worse. Document Released: 07/14/2006 Document Revised: 05/26/2011 Document Reviewed: 09/14/2006 ExitCare Patient Information 2014 ExitCare,  [place].   ________________________________________________________________________  WHAT IS A BLOOD TRANSFUSION? Blood Transfusion Information  A transfusion is the replacement of blood or some of its parts. Blood is made up of multiple cells which provide different functions.  Red blood cells carry oxygen and are used for blood loss replacement.  White blood cells fight against infection.  Platelets control bleeding.  Plasma helps clot blood.  Other blood products are available for specialized needs, such as hemophilia or other clotting disorders. BEFORE THE TRANSFUSION  Who gives blood for transfusions?   Healthy volunteers who are fully evaluated to make sure their blood is safe. This is blood bank blood. Transfusion therapy is the safest it has ever been in the practice of medicine. Before blood is taken from a donor, a complete history is taken to make sure that person has no history of diseases nor engages in risky social behavior (examples are intravenous drug use or sexual activity with multiple partners). The donor's travel history is screened to minimize risk of transmitting infections, such as malaria. The donated blood is tested for signs of infectious diseases, such as HIV and hepatitis. The blood is then tested to be sure it is compatible with you in order to minimize the chance of a transfusion reaction. If you or a relative donates blood, this is often done in anticipation of surgery and is not appropriate for emergency situations. It takes many days to process the donated blood. RISKS AND COMPLICATIONS Although transfusion therapy is very safe and saves many lives, the main dangers of transfusion include:   Getting an infectious disease.  Developing a transfusion reaction. This is an allergic reaction to something in the blood you were given. Every precaution is taken to prevent this.  The decision to have a blood transfusion has been considered carefully by your caregiver  before blood is given. Blood is not given unless the benefits outweigh the risks. AFTER THE TRANSFUSION  Right after receiving a blood transfusion, you will usually feel much better and more energetic. This is especially true if your red blood cells have gotten low (anemic). The transfusion raises the level of the red blood cells which carry oxygen, and this usually causes an energy increase.  The nurse administering the transfusion will monitor you carefully for complications. HOME CARE INSTRUCTIONS  No special instructions are needed after a transfusion. You may find your energy is better. Speak with your caregiver about any limitations on activity for underlying diseases you may have. SEEK MEDICAL CARE IF:   Your condition is not improving after your transfusion.  You develop redness or irritation at the intravenous (IV) site. SEEK IMMEDIATE MEDICAL CARE IF:  Any of the following symptoms occur over the next 12 hours:  Shaking chills.  You have a temperature by mouth above 102 F (38.9 C), not controlled by medicine.  Chest, back, or muscle pain.  People around you feel you are not acting correctly or are confused.  Shortness of breath or difficulty breathing.  Dizziness and fainting.  You get a rash or develop hives.  You have a decrease in urine output.  Your urine turns a dark color or changes to pink, red, or brown. Any of the following symptoms occur over the next 10 days:  You have a temperature by mouth above 102 F (38.9 C), not controlled by medicine.  Shortness of breath.  Weakness after normal activity.  The [place] part of the eye turns yellow (jaundice).  You have a decrease in the amount of urine or are urinating less often.  Your urine turns a dark color or changes to pink, red, or brown. Document Released: 02/29/2000 Document Revised: 05/26/2011 Document Reviewed: 10/18/2007 [facility] Patient Information 2014 [place],  [place].  _______________________________________________________________________

## 2015-02-13 NOTE — Pre-Procedure Instructions (Addendum)
EKG today. 08-11-16 Stress test [facility] ,report with chart. CXR 01-10-15 Epic. Dr. Landry Dyke made aware of Phentermine use today- pt.instructed not to take this med anymore prior to surgery.

## 2015-02-14 NOTE — H&P (Signed)
Chief Complaint Left renal mass   History of Present Illness [name] is a 49 year old seen at the request of Dr. Dianah Field for an incidentally detected left renal mass. She had presented to the emergency department on 12/05/14 with complaints of abdominal pain and nausea. She underwent a CT scan and ultimately was felt that she may be simply constipated. She has since had resolution of her abdominal pain symptoms. Incidentally, she was noted to have a 3 cm exophytic laterally located hyperdense left renal mass. This raises concern for possible malignancy. No regional lymphadenopathy or renal vein abnormalities were identified. No contralateral renal masses were noted. No adrenal masses were noted. No evidence of intra-abdominal metastatic disease was noted. She has no family history of kidney cancer. She has no history of chronic kidney disease. She is diabetic.   Past Medical History Problems  1. History of depression (Z86.59) 2. History of diabetes mellitus (Z86.39) 3. History of hypertension (Z86.79) 4. History of obesity (Z86.39) 5. 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 History of Normocytic anemia (D64.9)  Surgical History Problems  1. History of Cervical Conization 2. History of Cholecystectomy Laparoscopic 3. History of Excision Of Neuroma Of Foot 4. History of Neuroplasty Decompression Median Nerve At Carpal Tunnel 5. History of Sinus Surgery  Current Meds 1. Byetta 10 MCG Pen 10 MCG/0.04ML Subcutaneous Solution Pen-injector;  Therapy: (Recorded:26Oct2016) to Recorded 2. Hydrochlorothiazide 25 MG Oral Tablet;  Therapy: (Recorded:26Oct2016) to Recorded 3. Mobic 15 MG Oral Tablet (Meloxicam);  Therapy: (Recorded:26Oct2016) to Recorded 4. Phentermine HCl - 37.5 MG Oral Tablet;  Therapy: (Recorded:26Oct2016) to Recorded 5. Wellbutrin XL 300 MG Oral Tablet Extended Release 24 Hour (BuPROPion HCl ER (XL));  Therapy: (Recorded:26Oct2016) to  Recorded  Allergies Medication  1. Codeine Derivatives  Family History Problems  1. Denied: Family history of kidney cancer  Social History Problems    Denied: History of Alcohol use   Never a smoker  Review of Systems Genitourinary, constitutional, skin, eye, otolaryngeal, hematologic/lymphatic, cardiovascular, pulmonary, endocrine, musculoskeletal, gastrointestinal, neurological and psychiatric system(s) were reviewed and pertinent findings if present are noted and are otherwise negative.  Gastrointestinal: constipation.      Physical Exam Constitutional: Well nourished and well developed . No acute distress.  ENT:. The ears and nose are normal in appearance.  Neck: The appearance of the neck is normal and no neck mass is present.  Pulmonary: No respiratory distress, normal respiratory rhythm and effort and clear bilateral breath sounds.  Cardiovascular: Heart rate and rhythm are normal . No peripheral edema.  Abdomen: Incision site(s) well healed. The abdomen is obese. The abdomen is soft and nontender. No masses are palpated. No CVA tenderness. No hernias are palpable. No hepatosplenomegaly noted.  Lymphatics: The supraclavicular, femoral and inguinal nodes are not enlarged or tender.  Skin: Normal skin turgor, no visible rash and no visible skin lesions.  Neuro/Psych:. Mood and affect are appropriate.      Assessment Assessed  1. 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 Left renal mass: She will undergo a left robot-assisted laparoscopic partial nephrectomy.

## 2015-02-15 ENCOUNTER — Inpatient Hospital Stay (HOSPITAL_COMMUNITY): Payer: BLUE CROSS/BLUE SHIELD | Admitting: Anesthesiology

## 2015-02-15 ENCOUNTER — Inpatient Hospital Stay (HOSPITAL_COMMUNITY)
Admission: RE | Admit: 2015-02-15 | Discharge: 2015-02-17 | DRG: 658 | Disposition: A | Discharge status: Home / Self Care | Payer: BLUE CROSS/BLUE SHIELD | Source: Ambulatory Visit | Attending: Urology | Admitting: Urology

## 2015-02-15 ENCOUNTER — Encounter (HOSPITAL_COMMUNITY)
Admission: RE | Disposition: A | Discharge status: Home / Self Care | Payer: Self-pay | Source: Ambulatory Visit | Attending: Urology

## 2015-02-15 ENCOUNTER — Encounter (HOSPITAL_COMMUNITY): Payer: Self-pay | Admitting: Certified Registered"

## 2015-02-15 DIAGNOSIS — C642 Malignant neoplasm of left kidney, except renal pelvis: Secondary | ICD-10-CM | POA: Diagnosis present

## 2015-02-15 DIAGNOSIS — Z6835 Body mass index (BMI) 35.0-35.9, adult: Secondary | ICD-10-CM | POA: Diagnosis not present

## 2015-02-15 DIAGNOSIS — E669 Obesity, unspecified: Secondary | ICD-10-CM | POA: Diagnosis present

## 2015-02-15 DIAGNOSIS — Z9049 Acquired absence of other specified parts of digestive tract: Secondary | ICD-10-CM

## 2015-02-15 DIAGNOSIS — D649 Anemia, unspecified: Secondary | ICD-10-CM | POA: Diagnosis present

## 2015-02-15 DIAGNOSIS — Z01812 Encounter for preprocedural laboratory examination: Secondary | ICD-10-CM | POA: Diagnosis not present

## 2015-02-15 DIAGNOSIS — E119 Type 2 diabetes mellitus without complications: Secondary | ICD-10-CM | POA: Diagnosis present

## 2015-02-15 DIAGNOSIS — F329 Major depressive disorder, single episode, unspecified: Secondary | ICD-10-CM | POA: Diagnosis present

## 2015-02-15 DIAGNOSIS — Z85528 Personal history of other malignant neoplasm of kidney: Secondary | ICD-10-CM | POA: Diagnosis present

## 2015-02-15 DIAGNOSIS — N2889 Other specified disorders of kidney and ureter: Secondary | ICD-10-CM | POA: Diagnosis present

## 2015-02-15 DIAGNOSIS — I10 Essential (primary) hypertension: Secondary | ICD-10-CM | POA: Diagnosis present

## 2015-02-15 HISTORY — PX: ROBOTIC ASSITED PARTIAL NEPHRECTOMY: SHX6087

## 2015-02-15 LAB — TYPE AND SCREEN
ABO/RH(D): A POS
ANTIBODY SCREEN: NEGATIVE

## 2015-02-15 LAB — HEMOGLOBIN AND HEMATOCRIT, BLOOD
HEMATOCRIT: 30.5 % — AB (ref 36.0–46.0)
HEMOGLOBIN: 10 g/dL — AB (ref 12.0–15.0)

## 2015-02-15 LAB — BASIC METABOLIC PANEL
ANION GAP: 8 (ref 5–15)
BUN: 15 mg/dL (ref 6–20)
CO2: 28 mmol/L (ref 22–32)
Calcium: 8.6 mg/dL — ABNORMAL LOW (ref 8.9–10.3)
Chloride: 99 mmol/L — ABNORMAL LOW (ref 101–111)
Creatinine, Ser: 1.04 mg/dL — ABNORMAL HIGH (ref 0.44–1.00)
Glucose, Bld: 152 mg/dL — ABNORMAL HIGH (ref 65–99)
POTASSIUM: 3.6 mmol/L (ref 3.5–5.1)
Sodium: 135 mmol/L (ref 135–145)

## 2015-02-15 LAB — GLUCOSE, CAPILLARY
Glucose-Capillary: 130 mg/dL — ABNORMAL HIGH (ref 65–99)
Glucose-Capillary: 137 mg/dL — ABNORMAL HIGH (ref 65–99)
Glucose-Capillary: 139 mg/dL — ABNORMAL HIGH (ref 65–99)

## 2015-02-15 SURGERY — ROBOTIC ASSITED PARTIAL NEPHRECTOMY
Anesthesia: General | Laterality: Left

## 2015-02-15 MED ORDER — DIAZEPAM 2 MG PO TABS
2.0000 mg | ORAL_TABLET | Freq: Four times a day (QID) | ORAL | Status: DC | PRN
  Administered 2015-02-15 – 2015-02-16 (×3): 2 mg via ORAL
  Filled 2015-02-15 (×3): qty 1

## 2015-02-15 MED ORDER — SUCCINYLCHOLINE CHLORIDE 20 MG/ML IJ SOLN
INTRAMUSCULAR | Status: DC | PRN
  Administered 2015-02-15: 100 mg via INTRAVENOUS

## 2015-02-15 MED ORDER — PROPOFOL 10 MG/ML IV BOLUS
INTRAVENOUS | Status: AC
  Filled 2015-02-15: qty 20

## 2015-02-15 MED ORDER — LACTATED RINGERS IR SOLN
Status: DC | PRN
  Administered 2015-02-15: 1000 mL

## 2015-02-15 MED ORDER — SUGAMMADEX SODIUM 200 MG/2ML IV SOLN
INTRAVENOUS | Status: AC
  Filled 2015-02-15: qty 2

## 2015-02-15 MED ORDER — HYDROCODONE-ACETAMINOPHEN 5-325 MG PO TABS: 1.0000 | ORAL_TABLET | Freq: Four times a day (QID) | ORAL | Status: DC | PRN

## 2015-02-15 MED ORDER — DIPHENHYDRAMINE HCL 50 MG/ML IJ SOLN: 12.5000 mg | Freq: Four times a day (QID) | INTRAMUSCULAR | Status: DC | PRN

## 2015-02-15 MED ORDER — POTASSIUM CHLORIDE IN NACL 20-0.45 MEQ/L-% IV SOLN
INTRAVENOUS | Status: DC
  Administered 2015-02-15 – 2015-02-16 (×2): via INTRAVENOUS
  Filled 2015-02-15 (×4): qty 1000

## 2015-02-15 MED ORDER — CEFAZOLIN SODIUM-DEXTROSE 2-3 GM-% IV SOLR
2.0000 g | INTRAVENOUS | Status: AC
  Administered 2015-02-15: 2 g via INTRAVENOUS

## 2015-02-15 MED ORDER — DIPHENHYDRAMINE HCL 12.5 MG/5ML PO ELIX: 12.5000 mg | ORAL_SOLUTION | Freq: Four times a day (QID) | ORAL | Status: DC | PRN

## 2015-02-15 MED ORDER — ONDANSETRON HCL 4 MG/2ML IJ SOLN
INTRAMUSCULAR | Status: AC
  Filled 2015-02-15: qty 2

## 2015-02-15 MED ORDER — LIDOCAINE HCL (CARDIAC) 20 MG/ML IV SOLN
INTRAVENOUS | Status: DC | PRN
  Administered 2015-02-15: 50 mg via INTRAVENOUS

## 2015-02-15 MED ORDER — MORPHINE SULFATE (PF) 2 MG/ML IV SOLN
2.0000 mg | INTRAVENOUS | Status: DC | PRN
  Administered 2015-02-15: 2 mg via INTRAVENOUS
  Administered 2015-02-15: 3 mg via INTRAVENOUS
  Administered 2015-02-16: 2 mg via INTRAVENOUS
  Filled 2015-02-15 (×2): qty 1
  Filled 2015-02-15: qty 2

## 2015-02-15 MED ORDER — MIDAZOLAM HCL 2 MG/2ML IJ SOLN
INTRAMUSCULAR | Status: AC
  Filled 2015-02-15: qty 2

## 2015-02-15 MED ORDER — LACTATED RINGERS IV SOLN
INTRAVENOUS | Status: DC
  Administered 2015-02-15: 13:00:00 via INTRAVENOUS
  Administered 2015-02-15: 1000 mL via INTRAVENOUS
  Administered 2015-02-15: 14:00:00 via INTRAVENOUS

## 2015-02-15 MED ORDER — ONDANSETRON HCL 4 MG/2ML IJ SOLN
INTRAMUSCULAR | Status: DC | PRN
  Administered 2015-02-15: 4 mg via INTRAVENOUS

## 2015-02-15 MED ORDER — CEFAZOLIN SODIUM 1-5 GM-% IV SOLN
1.0000 g | Freq: Three times a day (TID) | INTRAVENOUS | Status: AC
  Administered 2015-02-15 – 2015-02-16 (×2): 1 g via INTRAVENOUS
  Filled 2015-02-15 (×2): qty 50

## 2015-02-15 MED ORDER — FENTANYL CITRATE (PF) 250 MCG/5ML IJ SOLN
INTRAMUSCULAR | Status: AC
  Filled 2015-02-15: qty 5

## 2015-02-15 MED ORDER — BOOST / RESOURCE BREEZE PO LIQD
1.0000 | Freq: Three times a day (TID) | ORAL | Status: DC
  Administered 2015-02-17: 1 via ORAL

## 2015-02-15 MED ORDER — ALBUTEROL SULFATE (2.5 MG/3ML) 0.083% IN NEBU
2.5000 mg | INHALATION_SOLUTION | Freq: Once | RESPIRATORY_TRACT | Status: AC
  Administered 2015-02-15: 2.5 mg via RESPIRATORY_TRACT

## 2015-02-15 MED ORDER — ONDANSETRON HCL 4 MG/2ML IJ SOLN: 4.0000 mg | INTRAMUSCULAR | Status: DC | PRN

## 2015-02-15 MED ORDER — BUPIVACAINE LIPOSOME 1.3 % IJ SUSP
20.0000 mL | Freq: Once | INTRAMUSCULAR | Status: AC
  Administered 2015-02-15: 20 mL
  Filled 2015-02-15: qty 20

## 2015-02-15 MED ORDER — FENTANYL CITRATE (PF) 100 MCG/2ML IJ SOLN
INTRAMUSCULAR | Status: AC
  Filled 2015-02-15: qty 2

## 2015-02-15 MED ORDER — ACETAMINOPHEN 10 MG/ML IV SOLN
1000.0000 mg | Freq: Four times a day (QID) | INTRAVENOUS | Status: DC
Start: 2015-02-15 — End: 2015-02-16
  Administered 2015-02-15 – 2015-02-16 (×2): 1000 mg via INTRAVENOUS
  Filled 2015-02-15 (×5): qty 100

## 2015-02-15 MED ORDER — ZOLPIDEM TARTRATE 5 MG PO TABS
5.0000 mg | ORAL_TABLET | Freq: Every evening | ORAL | Status: DC | PRN
  Administered 2015-02-16: 5 mg via ORAL
  Filled 2015-02-15: qty 1

## 2015-02-15 MED ORDER — SUGAMMADEX SODIUM 200 MG/2ML IV SOLN
INTRAVENOUS | Status: DC | PRN
  Administered 2015-02-15: 200 mg via INTRAVENOUS

## 2015-02-15 MED ORDER — MANNITOL 25 % IV SOLN
25.0000 g | Freq: Once | INTRAVENOUS | Status: AC
  Administered 2015-02-15 (×2): 12.5 g via INTRAVENOUS
  Filled 2015-02-15: qty 100

## 2015-02-15 MED ORDER — DEXAMETHASONE SODIUM PHOSPHATE 10 MG/ML IJ SOLN
INTRAMUSCULAR | Status: DC | PRN
  Administered 2015-02-15: 10 mg via INTRAVENOUS

## 2015-02-15 MED ORDER — STERILE WATER FOR IRRIGATION IR SOLN
Status: DC | PRN
  Administered 2015-02-15: 1000 mL

## 2015-02-15 MED ORDER — PROPOFOL 10 MG/ML IV BOLUS
INTRAVENOUS | Status: DC | PRN
  Administered 2015-02-15: 180 mg via INTRAVENOUS

## 2015-02-15 MED ORDER — DOCUSATE SODIUM 100 MG PO CAPS
100.0000 mg | ORAL_CAPSULE | Freq: Two times a day (BID) | ORAL | Status: DC
  Administered 2015-02-15 – 2015-02-17 (×4): 100 mg via ORAL
  Filled 2015-02-15 (×5): qty 1

## 2015-02-15 MED ORDER — CEFAZOLIN SODIUM 1-5 GM-% IV SOLN
INTRAVENOUS | Status: AC
  Filled 2015-02-15: qty 50

## 2015-02-15 MED ORDER — DEXAMETHASONE SODIUM PHOSPHATE 10 MG/ML IJ SOLN
INTRAMUSCULAR | Status: AC
  Filled 2015-02-15: qty 1

## 2015-02-15 MED ORDER — FENTANYL CITRATE (PF) 100 MCG/2ML IJ SOLN
INTRAMUSCULAR | Status: DC | PRN
  Administered 2015-02-15: 100 ug via INTRAVENOUS
  Administered 2015-02-15 (×3): 50 ug via INTRAVENOUS

## 2015-02-15 MED ORDER — ROCURONIUM BROMIDE 100 MG/10ML IV SOLN
INTRAVENOUS | Status: AC
  Filled 2015-02-15: qty 1

## 2015-02-15 MED ORDER — HYDROCHLOROTHIAZIDE 25 MG PO TABS
25.0000 mg | ORAL_TABLET | Freq: Every day | ORAL | Status: DC
  Administered 2015-02-15 – 2015-02-16 (×2): 25 mg via ORAL
  Filled 2015-02-15 (×3): qty 1

## 2015-02-15 MED ORDER — ONDANSETRON HCL 4 MG/2ML IJ SOLN: 4.0000 mg | Freq: Once | INTRAMUSCULAR | Status: DC | PRN

## 2015-02-15 MED ORDER — SODIUM CHLORIDE 0.9 % IJ SOLN
INTRAMUSCULAR | Status: AC
  Filled 2015-02-15: qty 20

## 2015-02-15 MED ORDER — ALBUTEROL SULFATE (2.5 MG/3ML) 0.083% IN NEBU
INHALATION_SOLUTION | RESPIRATORY_TRACT | Status: AC
  Filled 2015-02-15: qty 3

## 2015-02-15 MED ORDER — BUPROPION HCL ER (XL) 300 MG PO TB24
300.0000 mg | ORAL_TABLET | Freq: Every day | ORAL | Status: DC
  Administered 2015-02-16 – 2015-02-17 (×2): 300 mg via ORAL
  Filled 2015-02-15 (×2): qty 1

## 2015-02-15 MED ORDER — MIDAZOLAM HCL 5 MG/5ML IJ SOLN
INTRAMUSCULAR | Status: DC | PRN
  Administered 2015-02-15: 2 mg via INTRAVENOUS

## 2015-02-15 MED ORDER — FENTANYL CITRATE (PF) 100 MCG/2ML IJ SOLN
25.0000 ug | INTRAMUSCULAR | Status: DC | PRN
  Administered 2015-02-15 (×3): 50 ug via INTRAVENOUS

## 2015-02-15 MED ORDER — TOPIRAMATE 25 MG PO TABS
50.0000 mg | ORAL_TABLET | Freq: Every day | ORAL | Status: DC
  Filled 2015-02-15 (×3): qty 2

## 2015-02-15 MED ORDER — INSULIN ASPART 100 UNIT/ML ~~LOC~~ SOLN
0.0000 [IU] | SUBCUTANEOUS | Status: DC
  Administered 2015-02-15 – 2015-02-16 (×4): 2 [IU] via SUBCUTANEOUS

## 2015-02-15 MED ORDER — LIDOCAINE HCL (CARDIAC) 20 MG/ML IV SOLN
INTRAVENOUS | Status: AC
  Filled 2015-02-15: qty 5

## 2015-02-15 MED ORDER — MANNITOL 25 % IV SOLN
INTRAVENOUS | Status: AC
  Filled 2015-02-15: qty 100

## 2015-02-15 MED ORDER — ROCURONIUM BROMIDE 100 MG/10ML IV SOLN
INTRAVENOUS | Status: DC | PRN
  Administered 2015-02-15: 5 mg via INTRAVENOUS
  Administered 2015-02-15: 45 mg via INTRAVENOUS
  Administered 2015-02-15 (×2): 10 mg via INTRAVENOUS

## 2015-02-15 SURGICAL SUPPLY — 54 items
APL ESCP 34 STRL LF DISP (HEMOSTASIS)
APPLICATOR SURGIFLO ENDO (HEMOSTASIS) IMPLANT
BAG SPEC RTRVL LRG 6X4 10 (ENDOMECHANICALS) ×2
CABLE HIGH FREQUENCY MONO STRZ (ELECTRODE) ×4 IMPLANT
CHLORAPREP W/TINT 26ML (MISCELLANEOUS) ×4 IMPLANT
CLIP LIGATING HEM O LOK PURPLE (MISCELLANEOUS) ×4 IMPLANT
CLIP LIGATING HEMO O LOK GREEN (MISCELLANEOUS) ×8 IMPLANT
CORDS BIPOLAR (ELECTRODE) ×4 IMPLANT
COVER BACK TABLE 60X90IN (DRAPES) ×4 IMPLANT
COVER SURGICAL LIGHT HANDLE (MISCELLANEOUS) ×4 IMPLANT
COVER TIP SHEARS 8 DVNC (MISCELLANEOUS) ×3 IMPLANT
COVER TIP SHEARS 8MM DA VINCI (MISCELLANEOUS) ×4
DECANTER SPIKE VIAL GLASS SM (MISCELLANEOUS) ×4 IMPLANT
DRAIN CHANNEL 15F RND FF 3/16 (WOUND CARE) ×4 IMPLANT
DRAPE INCISE IOBAN 66X45 STRL (DRAPES) ×4 IMPLANT
DRAPE LAPAROSCOPIC ABDOMINAL (DRAPES) ×3 IMPLANT
DRAPE SHEET LG 3/4 BI-LAMINATE (DRAPES) ×8 IMPLANT
DRAPE WARM FLUID 44X44 (DRAPE) ×4 IMPLANT
ELECT PENCIL ROCKER SW 15FT (MISCELLANEOUS) ×4 IMPLANT
ELECT REM PT RETURN 9FT ADLT (ELECTROSURGICAL) ×4
ELECTRODE REM PT RTRN 9FT ADLT (ELECTROSURGICAL) ×2 IMPLANT
EVACUATOR SILICONE 100CC (DRAIN) ×4 IMPLANT
GLOVE BIO SURGEON STRL SZ 6.5 (GLOVE) ×3 IMPLANT
GLOVE BIO SURGEONS STRL SZ 6.5 (GLOVE) ×1
GLOVE BIOGEL M STRL SZ7.5 (GLOVE) ×8 IMPLANT
GOWN STRL REUS W/TWL LRG LVL3 (GOWN DISPOSABLE) ×12 IMPLANT
HEMOSTAT SURGICEL 4X8 (HEMOSTASIS) ×3 IMPLANT
KIT ACCESSORY DA VINCI DISP (KITS) ×2
KIT ACCESSORY DVNC DISP (KITS) ×2 IMPLANT
KIT BASIN OR (CUSTOM PROCEDURE TRAY) ×4 IMPLANT
LIQUID BAND (GAUZE/BANDAGES/DRESSINGS) ×5 IMPLANT
PEN SKIN MARKING BROAD (MISCELLANEOUS) ×3 IMPLANT
POSITIONER SURGICAL ARM (MISCELLANEOUS) ×8 IMPLANT
POUCH SPECIMEN RETRIEVAL 10MM (ENDOMECHANICALS) ×4 IMPLANT
SET TUBE IRRIG SUCTION NO TIP (IRRIGATION / IRRIGATOR) ×4 IMPLANT
SOLUTION ELECTROLUBE (MISCELLANEOUS) ×4 IMPLANT
SURGIFLO W/THROMBIN 8M KIT (HEMOSTASIS) ×4 IMPLANT
SUT ETHILON 3 0 PS 1 (SUTURE) ×4 IMPLANT
SUT MNCRL AB 4-0 PS2 18 (SUTURE) ×8 IMPLANT
SUT V-LOC BARB 180 2/0GR6 GS22 (SUTURE) ×4
SUT VIC AB 0 CT1 27 (SUTURE) ×4
SUT VIC AB 0 CT1 27XBRD ANTBC (SUTURE) ×2 IMPLANT
SUT VICRYL 0 UR6 27IN ABS (SUTURE) ×8 IMPLANT
SUT VLOC BARB 180 ABS3/0GR12 (SUTURE) ×4
SUTURE V-LC BRB 180 2/0GR6GS22 (SUTURE) ×2 IMPLANT
SUTURE VLOC BRB 180 ABS3/0GR12 (SUTURE) ×2 IMPLANT
TOWEL OR 17X26 10 PK STRL BLUE (TOWEL DISPOSABLE) ×8 IMPLANT
TRAY FOLEY CATH SILVER 14FR (SET/KITS/TRAYS/PACK) ×3 IMPLANT
TRAY LAPAROSCOPIC (CUSTOM PROCEDURE TRAY) ×4 IMPLANT
TROCAR 12M 150ML BLUNT (TROCAR) ×4 IMPLANT
TROCAR BLADELESS OPT 5 100 (ENDOMECHANICALS) ×4 IMPLANT
TROCAR UNIVERSAL OPT 12M 100M (ENDOMECHANICALS) ×4 IMPLANT
TROCAR XCEL 12X100 BLDLESS (ENDOMECHANICALS) ×4 IMPLANT
WATER STERILE IRR 1500ML POUR (IV SOLUTION) ×8 IMPLANT

## 2015-02-15 NOTE — Transfer of Care (Signed)
Immediate Anesthesia Transfer of Care Note  Patient: [name]  Procedure(s) Performed: Procedure(s): ROBOTIC ASSITED PARTIAL NEPHRECTOMY (Left)  Patient Location: PACU  Anesthesia Type:General  Level of Consciousness: awake, alert  and oriented  Airway & Oxygen Therapy: Patient Spontanous Breathing and Patient connected to face mask oxygen  Post-op Assessment: Report given to RN and Post -op Vital signs reviewed and stable  Post vital signs: Reviewed and stable  Last Vitals:  Filed Vitals:   02/15/15 0934  BP: 131/91  Pulse: 99  Temp: 36.5 C  Resp: 18    Complications: No apparent anesthesia complications

## 2015-02-15 NOTE — Anesthesia Procedure Notes (Signed)
Procedure Name: Intubation Date/Time: 02/15/2015 11:07 AM Performed by: Noralyn Pick D Pre-anesthesia Checklist: Patient identified, Emergency Drugs available, Suction available and Patient being monitored Patient Re-evaluated:Patient Re-evaluated prior to inductionOxygen Delivery Method: Circle System Utilized Preoxygenation: Pre-oxygenation with 100% oxygen Intubation Type: IV induction Ventilation: Mask ventilation without difficulty Laryngoscope Size: Mac and 3 Grade View: Grade II Tube type: Oral Tube size: 7.5 mm Number of attempts: 1 Airway Equipment and Method: Stylet and Oral airway Placement Confirmation: ETT inserted through vocal cords under direct vision,  positive ETCO2 and breath sounds checked- equal and bilateral Secured at: 21 cm Tube secured with: Tape Dental Injury: Teeth and Oropharynx as per pre-operative assessment

## 2015-02-15 NOTE — Addendum Note (Signed)
Addendum  created 02/15/15 2101 by Franne Grip, MD   Modules edited: Orders

## 2015-02-15 NOTE — Progress Notes (Signed)
Patient has a chest cold. Temp 97.7. Was coughing up mucus yesterda but not today. Lungs sound clear. Dr. Jillyn Hidden notified

## 2015-02-15 NOTE — Anesthesia Preprocedure Evaluation (Addendum)
Anesthesia Evaluation  Patient identified by MRN, date of birth, ID band Patient awake    Reviewed: Allergy & Precautions, NPO status , Patient's Chart, lab work & pertinent test results  History of Anesthesia Complications Negative for: history of anesthetic complications  Airway Mallampati: II  TM Distance: >3 FB Neck ROM: Full    Dental no notable dental hx. (+) Dental Advisory Given   Pulmonary neg pulmonary ROS,    Pulmonary exam normal breath sounds clear to auscultation       Cardiovascular hypertension, Normal cardiovascular exam Rhythm:Regular Rate:Normal     Neuro/Psych negative neurological ROS  negative psych ROS   GI/Hepatic negative GI ROS, Neg liver ROS,   Endo/Other  obesity  Renal/GU negative Renal ROS  negative genitourinary   Musculoskeletal negative musculoskeletal ROS (+)   Abdominal   Peds negative pediatric ROS (+)  Hematology  (+) anemia ,   Anesthesia Other Findings   Reproductive/Obstetrics negative OB ROS                            Anesthesia Physical Anesthesia Plan  ASA: II  Anesthesia Plan: General   Post-op Pain Management:    Induction: Intravenous  Airway Management Planned: Oral ETT  Additional Equipment:   Intra-op Plan:   Post-operative Plan: Extubation in OR  Informed Consent: I have reviewed the patients History and Physical, chart, labs and discussed the procedure including the risks, benefits and alternatives for the proposed anesthesia with the patient or authorized representative who has indicated his/her understanding and acceptance.   Dental advisory given  Plan Discussed with: CRNA  Anesthesia Plan Comments: (Reports a cold over the last week. Reports that it has gotten better today. No fever. Not coughing up anything today. No wheezing. Lungs are CLA bilaterally. Will do a preoperative albuterol treatment. )        Anesthesia Quick Evaluation

## 2015-02-15 NOTE — Discharge Instructions (Signed)
1.  Activity:  You are encouraged to ambulate frequently (about every hour during waking hours) to help prevent blood clots from forming in your legs or lungs.  However, you should not engage in any heavy lifting (> 10-15 lbs), strenuous activity, or straining. °2. Diet: You should advance your diet as instructed by your physician.  It will be normal to have some bloating, nausea, and abdominal discomfort intermittently. °3. Prescriptions:  You will be provided a prescription for pain medication to take as needed.  If your pain is not severe enough to require the prescription pain medication, you may take extra strength Tylenol instead which will have less side effects.  You should also take a prescribed stool softener to avoid straining with bowel movements as the prescription pain medication may constipate you. °4. Incisions: You may remove your dressing bandages 48 hours after surgery if not removed in the hospital.  You will either have some small staples or special tissue glue at each of the incision sites. Once the bandages are removed (if present), the incisions may stay open to air.  You may start showering (but not soaking or bathing in water) the 2nd day after surgery and the incisions simply need to be patted dry after the shower.  No additional care is needed. °5. What to call us about: You should call the office ([phone]) if you develop fever > 101 or develop persistent vomiting. ° °You may resume aspirin, advil, aleve, mobic, vitamins, and supplements 7 days after surgery. °

## 2015-02-15 NOTE — Progress Notes (Signed)
Patient ID: [name], female   DOB: April 29, 1965, 49 y.o.   MRN: [id number]  Post-op note  Subjective: The patient is doing well.  No complaints.  Objective: Vital signs in last 24 hours: Temp:  [97.7 F (36.5 C)-98.9 F (37.2 C)] 98.9 F (37.2 C) (12/01 1609) Pulse Rate:  [83-99] 83 (12/01 1609) Resp:  [9-18] 16 (12/01 1609) BP: (122-137)/(59-91) 129/76 mmHg (12/01 1650) SpO2:  [98 %-100 %] 98 % (12/01 1609) Weight:  [90.2 kg (198 lb 13.7 oz)-90.776 kg (200 lb 2 oz)] 90.2 kg (198 lb 13.7 oz) (12/01 1650)  Intake/Output from previous day:   Intake/Output this shift: Total I/O In: 2650 [I.V.:2650] Out: 550 [Urine:500; Blood:50]  Physical Exam:  General: Alert and oriented. Abdomen: Soft, Nondistended. Incisions: Clean and dry.  Lab Results:  Recent Labs  02/13/15 1100 02/15/15 1458  HGB 11.9* 10.0*  HCT 36.0 30.5*    Assessment/Plan: POD#0   1) Continue to monitor, bedrest tonight   Pryor Curia. MD   LOS: 0 days   Carman Essick,LES 02/15/2015, 4:59 PM

## 2015-02-15 NOTE — Op Note (Signed)
Preoperative diagnosis: Left renal mass  Postoperative diagnosis: Left renal mass  Procedure:  1. Left robotic-assisted laparoscopic partial nephrectomy  Surgeon: Pryor Curia. M.D.  Assistant(s): Debbrah Alar, PA-C  Resident: Dr. Gracy Racer  Anesthesia: General  Complications: None  EBL: 50 mL  IVF:  2200 mL crystalloid  Specimens: 1. Left renal mass  Disposition of specimens: Pathology  Intraoperative findings:       1. Warm renal ischemia time: 13 minutes  Drains: 1. # 15 Blake perinephric drain  Indication:  [name] is a 49 y.o. year old patient with a left renal mass.  After a thorough review of the management options for their renal mass, they elected to proceed with surgical treatment and the above procedure.  We have discussed the potential benefits and risks of the procedure, side effects of the proposed treatment, the likelihood of the patient achieving the goals of the procedure, and any potential problems that might occur during the procedure or recuperation. Informed consent has been obtained.   Description of procedure:  The patient was taken to the operating room and a general anesthetic was administered. The patient was given preoperative antibiotics, placed in the left modified flank position with care to pad all potential pressure points, and prepped and draped in the usual sterile fashion. Next a preoperative timeout was performed.  A site was selected on the left side of the umbilicus for placement of the camera port. This was placed using a standard open Hassan technique which allowed entry into the peritoneal cavity under direct vision and without difficulty. A 12 mm port was placed and a pneumoperitoneum established. The camera was then used to inspect the abdomen and there was no evidence of any intra-abdominal injuries or other abnormalities. The remaining abdominal ports were then placed. 8 mm robotic ports were placed in the  left upper quadrant, left lower quadrant, and far left lateral abdominal wall. A 12 mm port was placed in the upper midline for laparoscopic assistance. All ports were placed under direct vision without difficulty. The surgical cart was then docked.   Utilizing the cautery scissors, the white line of Toldt was incised allowing the colon to be mobilized medially and the plane between the mesocolon and the anterior layer of Gerota's fascia to be developed and the kidney to be exposed.  The ureter and gonadal vein were identified inferiorly and the ureter was lifted anteriorly off the psoas muscle.  Dissection proceeded superiorly along the gonadal vein until the renal vein was identified.  The renal hilum was then carefully isolated with a combination of blunt and sharp dissection allowing the renal arterial and venous structures to be separated and isolated in preparation for renal hilar vessel clamping. There were two renal arteries including a smaller lower pole and larger upper pole renal artery.  There also appeared to be two renal veins with one sizeable vein entering the gonadal vein from the lower pole.  12.5 g of IV mannitol was then administered.   Attention turned to the kidney and the perinephric fat surrounding the renal mass was removed and the kidney was mobilized sufficiently for exposure and resection of the renal mass.    Once the renal mass was properly isolated, preparations were made for resection of the tumor.  Reconstructive sutures were placed into the abdomen for the renorrhaphy portion of the procedure.  The two renal arteries were then clamped with bulldog clamps.  The tumor was then excised with cold scissor  dissection along with an adequate visible gross margin of normal renal parenchyma. The tumor appeared to be excised without any gross violation of the tumor. The renal collecting system was not entered during removal of the tumor.  A running 3-0 V-lock suture was then brought  through the capsule of the kidney and run along the base of the renal defect to provide hemostasis and close any entry into the renal collecting system if present. Weck clips were used to secure this suture outside the renal capsule at the proximal and distal ends. An additional hemostatic agent (Surgiflo) was then placed into the renal defect. A running 2-0 V lock suture was then used to close the renal capsule using a sliding clip technique which resulted in excellent compression of the renal defect.    The bulldog clamps were then removed from the renal hilar vessel(s) and an additional 12.5 g of IV mannitol was administered. Total warm renal ischemia time was 13 minutes. The renal tumor resection site was examined. Hemostasis appeared adequate.   The kidney was placed back into its normal anatomic position and covered with perinephric fat as needed.  A # 60 Blake drain was then brought through the lateral lower port site and positioned in the perinephric space.  It was secured to the skin with a nylon suture. The surgical cart was undocked.  The renal tumor specimen was removed intact within an endopouch retrieval bag via the camera port sites.  The camera port site and the other 12 mm port site were then closed at the fascial level with 0-vicryl suture.  All other laparoscopic/robotic ports were removed under direct vision and the pneumoperitoneum let down with inspection of the operative field performed and hemostasis again confirmed. All incision sites were then injected with local anesthetic and reapproximated at the skin level with 4-0 monocryl subcuticular closures.  Dermabond was applied to the skin.  The patient tolerated the procedure well and without complications.  The patient was able to be extubated and transferred to the recovery unit in satisfactory condition.  Pryor Curia MD

## 2015-02-15 NOTE — Anesthesia Postprocedure Evaluation (Signed)
Anesthesia Post Note  Patient: [name]  Procedure(s) Performed: Procedure(s) (LRB): ROBOTIC ASSITED PARTIAL NEPHRECTOMY (Left)  Patient location during evaluation: PACU Anesthesia Type: General Level of consciousness: awake and alert Pain management: pain level controlled Vital Signs Assessment: post-procedure vital signs reviewed and stable Respiratory status: spontaneous breathing, nonlabored ventilation, respiratory function stable and patient connected to nasal cannula oxygen Cardiovascular status: blood pressure returned to baseline and stable Postop Assessment: no signs of nausea or vomiting Anesthetic complications: no Comments: Sugammadex discharge instructions ordered.    Last Vitals:  Filed Vitals:   02/15/15 1609 02/15/15 1650  BP: 137/70 129/76  Pulse: 83   Temp: 37.2 C   Resp: 16     Last Pain:  Filed Vitals:   02/15/15 1651  PainSc: 4                  Parlee Amescua J

## 2015-02-16 LAB — BASIC METABOLIC PANEL
Anion gap: 5 (ref 5–15)
BUN: 14 mg/dL (ref 6–20)
CALCIUM: 8.7 mg/dL — AB (ref 8.9–10.3)
CHLORIDE: 102 mmol/L (ref 101–111)
CO2: 29 mmol/L (ref 22–32)
CREATININE: 1 mg/dL (ref 0.44–1.00)
Glucose, Bld: 115 mg/dL — ABNORMAL HIGH (ref 65–99)
Potassium: 4.2 mmol/L (ref 3.5–5.1)
SODIUM: 136 mmol/L (ref 135–145)

## 2015-02-16 LAB — GLUCOSE, CAPILLARY
GLUCOSE-CAPILLARY: 104 mg/dL — AB (ref 65–99)
Glucose-Capillary: 109 mg/dL — ABNORMAL HIGH (ref 65–99)
Glucose-Capillary: 98 mg/dL (ref 65–99)
Glucose-Capillary: 101 mg/dL — ABNORMAL HIGH (ref 65–99)
Glucose-Capillary: 146 mg/dL — ABNORMAL HIGH (ref 65–99)

## 2015-02-16 LAB — CREATININE, FLUID (PLEURAL, PERITONEAL, JP DRAINAGE): Creat, Fluid: 1 mg/dL

## 2015-02-16 LAB — HEMOGLOBIN AND HEMATOCRIT, BLOOD
HEMATOCRIT: 32 % — AB (ref 36.0–46.0)
HEMOGLOBIN: 10.3 g/dL — AB (ref 12.0–15.0)

## 2015-02-16 MED ORDER — BISACODYL 10 MG RE SUPP
10.0000 mg | Freq: Once | RECTAL | Status: AC
  Administered 2015-02-16: 10 mg via RECTAL
  Filled 2015-02-16: qty 1

## 2015-02-16 MED ORDER — METOCLOPRAMIDE HCL 5 MG/ML IJ SOLN
5.0000 mg | Freq: Once | INTRAMUSCULAR | Status: AC
  Administered 2015-02-16: 5 mg via INTRAVENOUS
  Filled 2015-02-16: qty 2

## 2015-02-16 MED ORDER — SODIUM CHLORIDE 0.45 % IV SOLN
INTRAVENOUS | Status: DC
  Administered 2015-02-16 – 2015-02-17 (×2): via INTRAVENOUS

## 2015-02-16 MED ORDER — GUAIFENESIN ER 600 MG PO TB12
600.0000 mg | ORAL_TABLET | Freq: Two times a day (BID) | ORAL | Status: DC
  Administered 2015-02-16 – 2015-02-17 (×3): 600 mg via ORAL
  Filled 2015-02-16 (×4): qty 1

## 2015-02-16 MED ORDER — HYDROCODONE-ACETAMINOPHEN 5-325 MG PO TABS
1.0000 | ORAL_TABLET | Freq: Four times a day (QID) | ORAL | Status: DC | PRN
  Administered 2015-02-16 (×2): 2 via ORAL
  Administered 2015-02-16: 1 via ORAL
  Administered 2015-02-17 (×2): 2 via ORAL
  Filled 2015-02-16 (×5): qty 2

## 2015-02-16 NOTE — Addendum Note (Signed)
Addendum  created 02/16/15 1018 by Donnella Bi, RN   Modules edited: Orders

## 2015-02-16 NOTE — Progress Notes (Signed)
Patient ID: [name], female   DOB: [birth year], 49 y.o.   MRN: [id number]  Post-op Day 1 PM note   Subjective: The patient continues to do well.   Pain has been well controlled. No N/V, tolerating a regular diet. She has ambulated.  JP creatinine equal to serum this afternoon. Voiding without difficulty since foley removal.  Objective: Vital signs in last 24 hours: Temp:  [97.8 F (36.6 C)-98 F (36.7 C)] 97.8 F (36.6 C) (12/02 1500) Pulse Rate:  [44-89] 44 (12/02 1500) Resp:  [16-18] 16 (12/02 1500) BP: (103-128)/(56-70) 103/56 mmHg (12/02 1500) SpO2:  [100 %] 100 % (12/02 1500)  Intake/Output from previous day: 12/01 0701 - 12/02 0700 In: 5450 [P.O.:600; I.V.:4650; IV Piggyback:200] Out: 2300 [Urine:2250; Blood:50] Intake/Output this shift: Total I/O In: 480 [P.O.:480] Out: 1030 [Urine:1000; Drains:30]  Physical Exam:  General: Alert and oriented. Abdomen: Soft, appropriately TTP, Nondistended, incisions without E/E/I. JP drain with SS output. GU: Foley out  Lab Results:  Recent Labs  02/15/15 1458 02/16/15 0523  HGB 10.0* 10.3*  HCT 30.5* 32.0*    Assessment/Plan: POD#1  - Regular diet - Medlocked - Voiding spontaneously - Ambulating - PO pain control - Likely D/C tomorrow AM   LOS: 1 day   Acie Fredrickson 02/16/2015, 5:46 PM

## 2015-02-16 NOTE — Progress Notes (Signed)
Patient ID: [name], female   DOB: [birth year], 49 y.o.   MRN: [id number]  Post-op note  Subjective: The patient is doing well.   Pain has been well controlled with IV tylenol and 1x Morphine. No N/V, flatus or BMs. Has been on bedrest. C/o some right sided abdominal wall spasms improved with heating pad.  Objective: Vital signs in last 24 hours: Temp:  [97.7 F (36.5 C)-98.9 F (37.2 C)] 98 F (36.7 C) (12/02 0410) Pulse Rate:  [81-99] 81 (12/02 0410) Resp:  [9-18] 16 (12/02 0410) BP: (115-137)/(59-91) 115/70 mmHg (12/02 0410) SpO2:  [98 %-100 %] 100 % (12/02 0410) Weight:  [90.2 kg (198 lb 13.7 oz)-90.776 kg (200 lb 2 oz)] 90.2 kg (198 lb 13.7 oz) (12/01 1650)  Intake/Output from previous day: 12/01 0701 - 12/02 0700 In: 4010 [P.O.:360; I.V.:3450; IV Piggyback:200] Out: 2300 [Urine:2250; Blood:50] Intake/Output this shift: Total I/O In: 1360 [P.O.:360; I.V.:800; IV Piggyback:200] Out: 1750 [Urine:1750]  Physical Exam:  General: Alert and oriented. Abdomen: Soft, appropriately TTP, Nondistended, incisions without E/E/I. JP drain with SS output. GU: Foley with clear yellow urine  Lab Results:  Recent Labs  02/13/15 1100 02/15/15 1458 02/16/15 0523  HGB 11.9* 10.0* 10.3*  HCT 36.0 30.5* 32.0*    Assessment/Plan: POD#1  - Regular diet - Medlock - D/C foley - F/u TOV - F/u JP output - Ambulate - PO pain control - Possible D/C this afternoon vs tomorrow AM   LOS: 1 day   Acie Fredrickson 02/16/2015, 6:25 AM

## 2015-02-16 NOTE — Progress Notes (Signed)
Patient ID: [name], female   DOB: 1965/09/15, 49 y.o.   MRN: [id number]  Pt doing ok overall although po intake has been poor this afternoon.  Denies nausea or vomiting.  Passing some flatus but complains of intermittent pain in upper abdomen and left lower abdomen.  Abd: Mild distention, decreased BS Inc: C/D/I  Drain Cr 1.0  Path pending  - IVF tonight - D/C drain - If feeling better in am, will d/c home

## 2015-02-16 NOTE — Care Management Note (Signed)
Case Management Note  Patient Details  Name: [name] MRN: [id number] Date of Birth: 04/24/1965  Subjective/Objective:   49 y/o f admitted w/Renal neoplasm,s/p partial nephrectomy. From home.                 Action/Plan:d/c plan home.   Expected Discharge Date:                  Expected Discharge Plan:  Home/Self Care  In-House Referral:     Discharge planning Services  CM Consult  Post Acute Care Choice:    Choice offered to:     DME Arranged:    DME Agency:     HH Arranged:    [place] Agency:     Status of Service:  Completed, signed off  Medicare Important Message Given:    Date Medicare IM Given:    Medicare IM give by:    Date Additional Medicare IM Given:    Additional Medicare Important Message give by:     If discussed at [place] of Stay Meetings, dates discussed:    Additional Comments:  Dessa Phi, RN 02/16/2015, 1:08 PM

## 2015-02-17 LAB — BASIC METABOLIC PANEL
Anion gap: 8 (ref 5–15)
BUN: 17 mg/dL (ref 6–20)
CALCIUM: 8.8 mg/dL — AB (ref 8.9–10.3)
CO2: 30 mmol/L (ref 22–32)
Chloride: 99 mmol/L — ABNORMAL LOW (ref 101–111)
Creatinine, Ser: 0.95 mg/dL (ref 0.44–1.00)
GFR calc Af Amer: >60 mL/min (ref >60)
GLUCOSE: 116 mg/dL — AB (ref 65–99)
POTASSIUM: 3.5 mmol/L (ref 3.5–5.1)
Sodium: 137 mmol/L (ref 135–145)

## 2015-02-17 LAB — GLUCOSE, CAPILLARY
Glucose-Capillary: 104 mg/dL — ABNORMAL HIGH (ref 65–99)
Glucose-Capillary: 120 mg/dL — ABNORMAL HIGH (ref 65–99)
Glucose-Capillary: 77 mg/dL (ref 65–99)

## 2015-02-17 LAB — HEMOGLOBIN AND HEMATOCRIT, BLOOD
HEMATOCRIT: 31.3 % — AB (ref 36.0–46.0)
HEMOGLOBIN: 10 g/dL — AB (ref 12.0–15.0)

## 2015-02-17 MED ORDER — OXYCODONE-ACETAMINOPHEN 5-325 MG PO TABS: 1.0000 | ORAL_TABLET | ORAL | Status: DC | PRN

## 2015-02-17 MED ORDER — BISACODYL 10 MG RE SUPP
10.0000 mg | Freq: Once | RECTAL | Status: AC
  Administered 2015-02-17: 10 mg via RECTAL
  Filled 2015-02-17: qty 1

## 2015-02-17 NOTE — Discharge Summary (Signed)
Physician Discharge Summary  Patient ID: [name] MRN: [id number] DOB/AGE: 47/17/1967 47 y.o.  Admit date: 02/15/2015 Discharge date: 02/17/2015  Admission Diagnoses: Renal mass  Discharge Diagnoses:  Active Problems:   Renal neoplasm   Discharged Condition: good  Hospital Course:  49 yo female who is s/p left robotic partial nephrectomy with Dr. Alinda Money. She did well post-operatively. Her diet was slowly advanced and at the time of discharge she was tolerating a regular diet, ambulating at her baseline, was voiding spontaneously after foley catheter removal, and pain was well controlled with oral narcotics. She was discharged to home on POD#2.  Consults: None  Significant Diagnostic Studies: JP creatinine 1.0  Treatments: surgery: left robotic partial nephrectomy   Discharge Exam: Blood pressure 111/63, pulse 100, temperature 98.9 F (37.2 C), temperature source Oral, resp. rate 18, height 5\' 3"  (1.6 m), weight 90.2 kg (198 lb 13.7 oz), last menstrual period 02/05/2015, SpO2 96 %. General appearance: alert, cooperative and appears stated age Head: Normocephalic, without obvious abnormality, atraumatic Resp: No increased work of breathing on room air GI: soft, appropriately tender to palpation, incisions without E/E/I, JP drain out Extremities: extremities normal, atraumatic, no cyanosis or edema  Disposition: 01-Home or Self Care     Medication List    STOP taking these medications        meloxicam 15 MG tablet  Commonly known as:  MOBIC      TAKE these medications        AMBULATORY NON FORMULARY MEDICATION  Needles for Byetta pen Dx:     buPROPion 300 MG 24 hr tablet  Commonly known as:  WELLBUTRIN XL  Take 1 tablet (300 mg total) by mouth daily.     exenatide 5 MCG/0.02ML Sopn injection  Commonly known as:  BYETTA 5 MCG PEN  10 g twice daily.     hydrochlorothiazide 25 MG tablet  Commonly known as:  HYDRODIURIL  Take 1 tablet (25 mg total) by  mouth daily.     HYDROcodone-acetaminophen 5-325 MG tablet  Commonly known as:  NORCO  Take 1-2 tablets by mouth every 6 (six) hours as needed.     phentermine 37.5 MG tablet  Commonly known as:  ADIPEX-P  One half tab by mouth twice a day     topiramate 50 MG tablet  Commonly known as:  TOPAMAX  One half tab by mouth daily for a week, then one tab by mouth daily.           Follow-up Information    Follow up with Dutch Gray, MD On 03/06/2015.   Specialty:  Urology   Why:  at 10:15   Contact information:   [place] [id number] [phone]       Signed: Acie Fredrickson 02/17/2015, 6:55 AM

## 2015-03-05 ENCOUNTER — Encounter: Payer: Self-pay | Admitting: Family

## 2015-03-05 ENCOUNTER — Other Ambulatory Visit (HOSPITAL_BASED_OUTPATIENT_CLINIC_OR_DEPARTMENT_OTHER): Payer: BLUE CROSS/BLUE SHIELD

## 2015-03-05 ENCOUNTER — Ambulatory Visit (HOSPITAL_BASED_OUTPATIENT_CLINIC_OR_DEPARTMENT_OTHER): Payer: BLUE CROSS/BLUE SHIELD | Admitting: Family

## 2015-03-05 VITALS — BP 126/54 | HR 86 | Temp 97.5°F | Resp 18 | Ht 63.0 in | Wt 200.0 lb

## 2015-03-05 DIAGNOSIS — D509 Iron deficiency anemia, unspecified: Secondary | ICD-10-CM

## 2015-03-05 DIAGNOSIS — C642 Malignant neoplasm of left kidney, except renal pelvis: Secondary | ICD-10-CM

## 2015-03-05 LAB — CBC WITH DIFFERENTIAL (CANCER CENTER ONLY)
BASO#: 0.1 10*3/uL (ref 0.0–0.2)
BASO%: 0.7 % (ref 0.0–2.0)
EOS%: 5 % (ref 0.0–7.0)
Eosinophils Absolute: 0.6 10*3/uL — ABNORMAL HIGH (ref 0.0–0.5)
HCT: 32.5 % — ABNORMAL LOW (ref 34.8–46.6)
HGB: 10.5 g/dL — ABNORMAL LOW (ref 11.6–15.9)
LYMPH#: 3 10*3/uL (ref 0.9–3.3)
LYMPH%: 24.3 % (ref 14.0–48.0)
MCH: 30.2 pg (ref 26.0–34.0)
MCHC: 32.3 g/dL (ref 32.0–36.0)
MCV: 93 fL (ref 81–101)
MONO#: 0.6 10*3/uL (ref 0.1–0.9)
MONO%: 5.1 % (ref 0.0–13.0)
NEUT#: 8 10*3/uL — ABNORMAL HIGH (ref 1.5–6.5)
NEUT%: 64.9 % (ref 39.6–80.0)
PLATELETS: 357 10*3/uL (ref 145–400)
RBC: 3.48 10*6/uL — AB (ref 3.70–5.32)
RDW: 14.7 % (ref 11.1–15.7)
WBC: 12.2 10*3/uL — AB (ref 3.9–10.0)

## 2015-03-05 NOTE — Progress Notes (Signed)
Hematology and Oncology Follow Up Visit  [name] [id number] 10-Mar-1966 49 y.o. 03/05/2015   Principle Diagnosis:  1. Papillary renal cell carcinoma, Fuhrman grade 2  2. Iron deficiency anemia   Current Therapy:   Left robotic partial nephrectomy 02/15/15 IV iron as indicated     Interim History:  [name] is here today for a follow-up. She has recuperated nicely from her left partial nephrectomy earlier this month. Her pathology report was positive for papillary renal cell carcinoma. Her resection margins were clear.  Her 5 incision sites to the abdomen are healing nicely. No redness or signs of infection.  She has some left sided abdominal pain and bloating since surgery but this continues to improve.  Her Hgb is stable at 12.5 No episodes of bleeding.  She has had no fever, chills, n/v, cough, rash, dizziness, headaches, blurred vision, SOB, chest pain palpitations or changes in bowel or bladder habits.  No swelling, tenderness, numbness or tingling in her extremities.  She is eating well and staying hydrated. Her weight is stable.  She would like to possibly return to work tomorrow on [employer] duty.   Medications:    Medication List       This list is accurate as of: 03/05/15  1:00 PM.  Always use your most recent med list.               AMBULATORY NON FORMULARY MEDICATION  Needles for Byetta pen Dx:     buPROPion 300 MG 24 hr tablet  Commonly known as:  WELLBUTRIN XL  Take 1 tablet (300 mg total) by mouth daily.     exenatide 5 MCG/0.02ML Sopn injection  Commonly known as:  BYETTA 5 MCG PEN  10 g twice daily.     hydrochlorothiazide 25 MG tablet  Commonly known as:  HYDRODIURIL  Take 1 tablet (25 mg total) by mouth daily.     oxyCODONE-acetaminophen 5-325 MG tablet  Commonly known as:  ROXICET  Take 1-2 tablets by mouth every 4 (four) hours as needed for severe pain.     phentermine 37.5 MG tablet  Commonly known as:  ADIPEX-P  One half tab by  mouth twice a day     topiramate 50 MG tablet  Commonly known as:  TOPAMAX  One half tab by mouth daily for a week, then one tab by mouth daily.        Allergies:  Allergies  Allergen Reactions  . Codeine Nausea And Vomiting  . Hydromorphone Hcl Nausea And Vomiting    Past Medical History, Surgical history, Social history, and Family History were reviewed and updated.  Review of Systems: All other 10 point review of systems is negative.   Physical Exam:  height is 5\' 3"  (1.6 m) and weight is 200 lb (90.719 kg). Her oral temperature is 97.5 F (36.4 C). Her blood pressure is 126/54 and her pulse is 86. Her respiration is 18.   Wt Readings from Last 3 Encounters:  03/05/15 200 lb (90.719 kg)  02/15/15 198 lb 13.7 oz (90.2 kg)  02/13/15 200 lb 2 oz (90.776 kg)    Ocular: Sclerae unicteric, pupils equal, round and reactive to light Ear-nose-throat: Oropharynx clear, dentition fair Lymphatic: No cervical supraclavicular or axillary adenopathy Lungs no rales or rhonchi, good excursion bilaterally Heart regular rate and rhythm, no murmur appreciated Abd soft, tender in the left upper quadrant, positive bowel sounds MSK no focal spinal tenderness, no joint edema Neuro: non-focal, well-oriented, appropriate affect Breasts:  Deferred  Lab Results  Component Value Date   WBC 12.2* 03/05/2015   HGB 10.5* 03/05/2015   HCT 32.5* 03/05/2015   MCV 93 03/05/2015   PLT 357 03/05/2015   Lab Results  Component Value Date   FERRITIN 35 01/17/2015   IRON 30* 01/17/2015   TIBC 348 01/17/2015   UIBC 317 01/17/2015   IRONPCTSAT 9* 01/17/2015   Lab Results  Component Value Date   RETICCTPCT 1.2 01/17/2015   RBC 3.48* 03/05/2015   RETICCTABS 48.7 01/17/2015   No results found for: KPAFRELGTCHN, LAMBDASER, KAPLAMBRATIO No results found for: IGGSERUM, IGA, IGMSERUM No results found for: Ronnald Ramp, A1GS, Nelida Meuse, SPEI   Chemistry        Component Value Date/Time   NA 137 02/17/2015 0454   K 3.5 02/17/2015 0454   CL 99* 02/17/2015 0454   CO2 30 02/17/2015 0454   BUN 17 02/17/2015 0454   CREATININE 0.95 02/17/2015 0454      Component Value Date/Time   CALCIUM 8.8* 02/17/2015 0454     Impression and Plan: [name] is a very pleasant 49 yo white female with iron deficiency anemia and a recent diagnosis of papillary renal cell carcinoma of the left kidney. She had a partial nephrectomy on 12/1 and has recovered nicely. Her resection margins were negative. She still has some tenderness in the left upper quadrant that is slowly improving.  She has had no episodes of bleeding. Her lap sites are healing with no redness or signs of infection.  Her Hgb is holding at 10.5. We will see what her iron studies show.  She is interested in having the [facility] genetic testing done but wants to wait until after Christmas.  We will repeat CT scans on her in 3 months and plan to see her back that same day for a follow-up and lab work.  She will contact us with any questions or concerns. We can certainly see her sooner if need be.   Eliezer Bottom, NP 12/19/20161:00 PM

## 2015-03-06 LAB — IRON AND TIBC
%SAT: 25 % (ref 21–57)
Iron: 64 ug/dL (ref 41–142)
TIBC: 259 ug/dL (ref 236–444)
UIBC: 195 ug/dL (ref 120–384)

## 2015-03-06 LAB — FERRITIN: Ferritin: 330 ng/ml — ABNORMAL HIGH (ref 9–269)

## 2015-03-22 ENCOUNTER — Ambulatory Visit (INDEPENDENT_AMBULATORY_CARE_PROVIDER_SITE_OTHER): Payer: BLUE CROSS/BLUE SHIELD | Admitting: Sports Medicine

## 2015-03-22 ENCOUNTER — Encounter: Payer: Self-pay | Admitting: Sports Medicine

## 2015-03-22 VITALS — BP 147/90 | HR 89 | Temp 97.7°F | Resp 18 | Wt 200.0 lb

## 2015-03-22 DIAGNOSIS — Z85528 Personal history of other malignant neoplasm of kidney: Secondary | ICD-10-CM | POA: Diagnosis not present

## 2015-03-22 DIAGNOSIS — E669 Obesity, unspecified: Secondary | ICD-10-CM | POA: Diagnosis not present

## 2015-03-22 MED ORDER — PHENTERMINE HCL 37.5 MG PO TABS: ORAL_TABLET | ORAL | Status: DC

## 2015-03-22 MED ORDER — EXENATIDE ER 2 MG ~~LOC~~ PEN: 2.0000 mg | PEN_INJECTOR | SUBCUTANEOUS | Status: DC

## 2015-03-22 NOTE — Assessment & Plan Note (Signed)
Restarting weight loss treatment from day one, Bydureon, phentermine, declines Topamax.

## 2015-03-22 NOTE — Assessment & Plan Note (Signed)
Doing well post wedge nephrectomy.

## 2015-03-22 NOTE — Progress Notes (Signed)
  Subjective:    CC: Post op F/U  HPI: Patient returns to clinic after receiving a left robotic-assisted laparoscopic partial nephrectomy for a RCC. She reports only one event post op described as "kidney spasms" after overdoing it around christmas time. She says she was in pain for ~3 days that was the only time she needed her Rx pain medication routinely. Since that event, she has been recovering well and reports only mild ongoing abdominal pain with no urinary symptoms (pain, blood, or frequency). She also says that she feels a little bloated.  She says that she wants to restart her weight loss medications since she is concerned with the weight she has gained post operatively. The only side effect reported from her previous regimen was "grogginess" from the topiramate. She also complains of daily injections with the GLP-1 agonist.  Past medical history, Surgical history, Family history not pertinant except as noted below, Social history, Allergies, and medications have been entered into the medical record, reviewed, and no changes needed.   Review of Systems: No fevers, chills, night sweats, weight loss, chest pain, or shortness of breath.   Objective:    General: Well Developed, well nourished, and in no acute distress.  Neuro: Alert and oriented x3, extra-ocular muscles intact, sensation grossly intact.  HEENT: Normocephalic, atraumatic, pupils equal round reactive to light, neck supple, no masses, no lymphadenopathy, thyroid nonpalpable.  Skin: Warm and dry, no rashes. Cardiac: Regular rate and rhythm, no murmurs rubs or gallops, trace edema noted.  Respiratory: Clear to auscultation bilaterally. Not using accessory muscles, speaking in full sentences.   Impression and Recommendations:    Patient is recovering well in the post-op period. Will restart phentermine and change GLP-1 agonist therapy to Bydureon.

## 2015-03-29 ENCOUNTER — Ambulatory Visit (INDEPENDENT_AMBULATORY_CARE_PROVIDER_SITE_OTHER): Payer: BLUE CROSS/BLUE SHIELD | Admitting: Obstetrics & Gynecology

## 2015-03-29 ENCOUNTER — Encounter: Payer: Self-pay | Admitting: Obstetrics & Gynecology

## 2015-03-29 VITALS — BP 133/66 | HR 108 | Resp 16 | Ht 63.0 in | Wt 200.0 lb

## 2015-03-29 DIAGNOSIS — Z0000 Encounter for general adult medical examination without abnormal findings: Secondary | ICD-10-CM

## 2015-03-29 DIAGNOSIS — Z01419 Encounter for gynecological examination (general) (routine) without abnormal findings: Secondary | ICD-10-CM | POA: Diagnosis not present

## 2015-03-29 DIAGNOSIS — Z1151 Encounter for screening for human papillomavirus (HPV): Secondary | ICD-10-CM

## 2015-03-29 DIAGNOSIS — Z124 Encounter for screening for malignant neoplasm of cervix: Secondary | ICD-10-CM | POA: Diagnosis not present

## 2015-03-29 NOTE — Progress Notes (Signed)
Subjective:    [name] is a 50 y.o. MW P43 (26 yo son) female who presents for an annual exam. The patient has no complaints today. She reports 3 day, light periods. The patient is sexually active. GYN screening history: last pap: was normal at [facility].  The patient wears seatbelts: yes. The patient participates in regular exercise: no. Has the patient ever been transfused or tattooed?: no. The patient reports that there is not domestic violence in her life.   Menstrual History: OB History    Gravida Para Term Preterm AB TAB SAB Ectopic Multiple Living   2 1   1  1   1       Menarche age: 28  Patient's last menstrual period was 02/08/2015.    The following portions of the patient's history were reviewed and updated as appropriate: allergies, current medications, past family history, past medical history, past social history, past surgical history and problem list.  Review of Systems Pertinent items noted in HPI and remainder of comprehensive ROS otherwise negative. She has had a flu vaccine this season. She has never had a mammogram. Married for 28 years, denies dyspareunia. Husband has had a vasectomy. 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   Objective:    BP 133/66 mmHg  Pulse 108  Resp 16  Ht 5\' 3"  (1.6 m)  Wt 200 lb (90.719 kg)  BMI 35.44 kg/m2  LMP 02/08/2015  General Appearance:    Alert, cooperative, no distress, appears stated age  Head:    Normocephalic, without obvious abnormality, atraumatic  Eyes:    PERRL, conjunctiva/corneas clear, EOM's intact, fundi    benign, both eyes  Ears:    Normal TM's and external ear canals, both ears  Nose:   Nares normal, septum midline, mucosa normal, no drainage    or sinus tenderness  Throat:   Lips, mucosa, and tongue normal; teeth and gums normal  Neck:   Supple, symmetrical, trachea midline, no adenopathy;    thyroid:  no enlargement/tenderness/nodules; no carotid   bruit or JVD  Back:     Symmetric, no curvature, ROM  normal, no CVA tenderness  Lungs:     Clear to auscultation bilaterally, respirations unlabored  Chest Wall:    No tenderness or deformity   Heart:    Regular rate and rhythm, S1 and S2 normal, no murmur, rub   or gallop  Breast Exam:    No tenderness, masses, or nipple abnormality  Abdomen:     Soft, non-tender, bowel sounds active all four quadrants,    no masses, no organomegaly  Genitalia:    Normal female without lesion, discharge or tenderness, mild atrophy, NSSA, NT, no palpable adnexal masses     Extremities:   Extremities normal, atraumatic, no cyanosis or edema  Pulses:   2+ and symmetric all extremities  Skin:   Skin color, texture, turgor normal, no rashes or lesions  Lymph nodes:   Cervical, supraclavicular, and axillary nodes normal  Neurologic:   CNII-XII intact, normal strength, sensation and reflexes    throughout  .    Assessment:    Healthy female exam.    Plan:     Breast self exam technique reviewed and patient encouraged to perform self-exam monthly. Mammogram. Thin prep Pap smear. with cotesting

## 2015-04-03 LAB — CYTOLOGY - PAP

## 2015-04-04 ENCOUNTER — Ambulatory Visit (INDEPENDENT_AMBULATORY_CARE_PROVIDER_SITE_OTHER): Payer: BLUE CROSS/BLUE SHIELD

## 2015-04-04 DIAGNOSIS — Z1231 Encounter for screening mammogram for malignant neoplasm of breast: Secondary | ICD-10-CM

## 2015-04-04 DIAGNOSIS — Z0000 Encounter for general adult medical examination without abnormal findings: Secondary | ICD-10-CM

## 2015-04-19 ENCOUNTER — Ambulatory Visit: Payer: BLUE CROSS/BLUE SHIELD | Admitting: Sports Medicine

## 2015-04-20 ENCOUNTER — Encounter: Payer: Self-pay | Admitting: Sports Medicine

## 2015-04-20 ENCOUNTER — Ambulatory Visit (INDEPENDENT_AMBULATORY_CARE_PROVIDER_SITE_OTHER): Payer: BLUE CROSS/BLUE SHIELD | Admitting: Sports Medicine

## 2015-04-20 VITALS — BP 130/89 | HR 86 | Resp 18 | Wt 195.0 lb

## 2015-04-20 DIAGNOSIS — E669 Obesity, unspecified: Secondary | ICD-10-CM

## 2015-04-20 DIAGNOSIS — Z85528 Personal history of other malignant neoplasm of kidney: Secondary | ICD-10-CM

## 2015-04-20 MED ORDER — PHENTERMINE HCL 37.5 MG PO TABS: ORAL_TABLET | ORAL | Status: DC

## 2015-04-20 NOTE — Progress Notes (Signed)
  Subjective:    CC: weight check  HPI: [name] lost 5 pounds after the first month on phentermine and bydurian.  Urinary hesitancy: Present for a few days, no dysuria, no fevers, chills, flank pain. Recent wedge nephrectomy for renal cell carcinoma.  Past medical history, Surgical history, Family history not pertinant except as noted below, Social history, Allergies, and medications have been entered into the medical record, reviewed, and no changes needed.   Review of Systems: No fevers, chills, night sweats, weight loss, chest pain, or shortness of breath.   Objective:    General: Well Developed, well nourished, and in no acute distress.  Neuro: Alert and oriented x3, extra-ocular muscles intact, sensation grossly intact.  HEENT: Normocephalic, atraumatic, pupils equal round reactive to light, neck supple, no masses, no lymphadenopathy, thyroid nonpalpable.  Skin: Warm and dry, no rashes. Cardiac: Regular rate and rhythm, no murmurs rubs or gallops, no lower extremity edema.  Respiratory: Clear to auscultation bilaterally. Not using accessory muscles, speaking in full sentences. Abdomen: Soft, minimally tender in the left lower quadrant, nondistended, normal bowel sounds, palpable masses, no guarding, no rigidity, no rebound tenderness, no costovertebral angle pain.  Impression and Recommendations:    I spent 25 minutes with this patient, greater than 50% was face-to-face time counseling regarding the above diagnoses

## 2015-04-20 NOTE — Assessment & Plan Note (Signed)
Noted some increasing urinary hesitancy, she will return after lunch and go downstairs for urinalysis.

## 2015-04-20 NOTE — Assessment & Plan Note (Signed)
5 pound weight loss after one month on phentermine and bydureon. Refilling medications. Return in one month.

## 2015-04-21 LAB — URINE CULTURE
Colony Count: NO GROWTH
Organism ID, Bacteria: NO GROWTH

## 2015-04-21 LAB — URINALYSIS
Bilirubin Urine: NEGATIVE
Glucose, UA: NEGATIVE
Ketones, ur: NEGATIVE
Leukocytes, UA: NEGATIVE
Nitrite: NEGATIVE
Protein, ur: NEGATIVE
Specific Gravity, Urine: 1.008 (ref 1.001–1.035)
pH: 6 (ref 5.0–8.0)

## 2015-05-13 ENCOUNTER — Other Ambulatory Visit: Payer: Self-pay | Admitting: Sports Medicine

## 2015-05-18 ENCOUNTER — Ambulatory Visit (INDEPENDENT_AMBULATORY_CARE_PROVIDER_SITE_OTHER): Payer: BLUE CROSS/BLUE SHIELD | Admitting: Sports Medicine

## 2015-05-18 ENCOUNTER — Encounter: Payer: Self-pay | Admitting: Sports Medicine

## 2015-05-18 VITALS — BP 137/78 | HR 92 | Wt 192.0 lb

## 2015-05-18 DIAGNOSIS — E669 Obesity, unspecified: Secondary | ICD-10-CM

## 2015-05-18 MED ORDER — PHENTERMINE HCL 37.5 MG PO TABS: ORAL_TABLET | ORAL | Status: DC

## 2015-05-18 NOTE — Assessment & Plan Note (Signed)
Minimal weight loss after the second month on phentermine and bydureon, she has been missing multiple doses. Next line refilling medications, return in one month.

## 2015-05-18 NOTE — Progress Notes (Signed)
  Subjective:    CC: Weight check  HPI: [name] returns, she is missed several days of her medications but has still lost a bit of weight. No side effects, no questions.  Past medical history, Surgical history, Family history not pertinant except as noted below, Social history, Allergies, and medications have been entered into the medical record, reviewed, and no changes needed.   Review of Systems: No fevers, chills, night sweats, weight loss, chest pain, or shortness of breath.   Objective:    General: Well Developed, well nourished, and in no acute distress.  Neuro: Alert and oriented x3, extra-ocular muscles intact, sensation grossly intact.  HEENT: Normocephalic, atraumatic, pupils equal round reactive to light, neck supple, no masses, no lymphadenopathy, thyroid nonpalpable.  Skin: Warm and dry, no rashes. Cardiac: Regular rate and rhythm, no murmurs rubs or gallops, no lower extremity edema.  Respiratory: Clear to auscultation bilaterally. Not using accessory muscles, speaking in full sentences.  Impression and Recommendations:

## 2015-06-04 ENCOUNTER — Ambulatory Visit: Payer: BLUE CROSS/BLUE SHIELD | Admitting: Family

## 2015-06-04 ENCOUNTER — Other Ambulatory Visit: Payer: BLUE CROSS/BLUE SHIELD

## 2015-06-04 ENCOUNTER — Other Ambulatory Visit (HOSPITAL_BASED_OUTPATIENT_CLINIC_OR_DEPARTMENT_OTHER): Payer: BLUE CROSS/BLUE SHIELD

## 2015-06-08 ENCOUNTER — Ambulatory Visit (HOSPITAL_BASED_OUTPATIENT_CLINIC_OR_DEPARTMENT_OTHER): Payer: BLUE CROSS/BLUE SHIELD | Admitting: Family

## 2015-06-08 ENCOUNTER — Ambulatory Visit (HOSPITAL_BASED_OUTPATIENT_CLINIC_OR_DEPARTMENT_OTHER): Payer: BLUE CROSS/BLUE SHIELD

## 2015-06-08 ENCOUNTER — Encounter: Payer: Self-pay | Admitting: Family

## 2015-06-08 ENCOUNTER — Other Ambulatory Visit (HOSPITAL_BASED_OUTPATIENT_CLINIC_OR_DEPARTMENT_OTHER): Payer: BLUE CROSS/BLUE SHIELD

## 2015-06-08 ENCOUNTER — Encounter: Payer: Self-pay | Admitting: Hematology & Oncology

## 2015-06-08 VITALS — BP 133/93 | HR 88 | Temp 97.7°F | Resp 18 | Ht 63.0 in | Wt 193.0 lb

## 2015-06-08 DIAGNOSIS — C642 Malignant neoplasm of left kidney, except renal pelvis: Secondary | ICD-10-CM | POA: Diagnosis not present

## 2015-06-08 DIAGNOSIS — D509 Iron deficiency anemia, unspecified: Secondary | ICD-10-CM | POA: Diagnosis not present

## 2015-06-08 DIAGNOSIS — Z85528 Personal history of other malignant neoplasm of kidney: Secondary | ICD-10-CM

## 2015-06-08 LAB — CBC WITH DIFFERENTIAL (CANCER CENTER ONLY)
BASO#: 0 10*3/uL (ref 0.0–0.2)
BASO%: 0.4 % (ref 0.0–2.0)
EOS ABS: 0.3 10*3/uL (ref 0.0–0.5)
EOS%: 3.6 % (ref 0.0–7.0)
HCT: 35.4 % (ref 34.8–46.6)
HGB: 11.9 g/dL (ref 11.6–15.9)
LYMPH#: 2.6 10*3/uL (ref 0.9–3.3)
LYMPH%: 28.4 % (ref 14.0–48.0)
MCH: 31.2 pg (ref 26.0–34.0)
MCHC: 33.6 g/dL (ref 32.0–36.0)
MCV: 93 fL (ref 81–101)
MONO#: 0.5 10*3/uL (ref 0.1–0.9)
MONO%: 6 % (ref 0.0–13.0)
NEUT#: 5.5 10*3/uL (ref 1.5–6.5)
NEUT%: 61.6 % (ref 39.6–80.0)
PLATELETS: 298 10*3/uL (ref 145–400)
RBC: 3.81 10*6/uL (ref 3.70–5.32)
RDW: 12.8 % (ref 11.1–15.7)
WBC: 9 10*3/uL (ref 3.9–10.0)

## 2015-06-08 LAB — COMPREHENSIVE METABOLIC PANEL
ALT: 17 U/L (ref 0–55)
ANION GAP: 9 meq/L (ref 3–11)
AST: 15 U/L (ref 5–34)
Albumin: 3.8 g/dL (ref 3.5–5.0)
Alkaline Phosphatase: 105 U/L (ref 40–150)
BILIRUBIN TOTAL: 0.37 mg/dL (ref 0.20–1.20)
BUN: 15.2 mg/dL (ref 7.0–26.0)
CHLORIDE: 102 meq/L (ref 98–109)
CO2: 31 meq/L — AB (ref 22–29)
Calcium: 9.7 mg/dL (ref 8.4–10.4)
Creatinine: 1 mg/dL (ref 0.6–1.1)
EGFR: 64 mL/min/{1.73_m2} — AB (ref >90)
Glucose: 90 mg/dl (ref 70–140)
POTASSIUM: 4.1 meq/L (ref 3.5–5.1)
SODIUM: 142 meq/L (ref 136–145)
Total Protein: 7.8 g/dL (ref 6.4–8.3)

## 2015-06-08 LAB — IRON AND TIBC
%SAT: 25 % (ref 21–57)
IRON: 67 ug/dL (ref 41–142)
TIBC: 270 ug/dL (ref 236–444)
UIBC: 203 ug/dL (ref 120–384)

## 2015-06-08 LAB — FERRITIN: FERRITIN: 243 ng/mL (ref 9–269)

## 2015-06-08 NOTE — Progress Notes (Signed)
Hematology and Oncology Follow Up Visit  AJANAY GAMET [id number] 22-May-1965 50 y.o. 06/08/2015   Principle Diagnosis:  1. Papillary renal cell carcinoma, Fuhrman grade 2  2. Iron deficiency anemia   Current Therapy:   Left robotic partial nephrectomy 02/15/15 IV iron as indicated     Interim History:  Ms. Hemric is here today for a follow-up. She is doing quite and has no complaints at this time. Her surgical incisions have healed nicely. She is back at work and enjoying herself.  Her CT scan in February showed no evidence of recurrent or metastatic disease.  No fever, chills, n/v, cough, rash, dizziness, headaches, blurred vision, SOB, chest pain palpitations or changes in bowel or bladder habits.  No swelling, tenderness, numbness or tingling in her extremities. No c/o joint aches or "bone" pain.  She is eating well and staying hydrated. Her weight is down 7 lbs since her last visit. She is on a weight loss program and taking phentermine.   Medications:    Medication List       This list is accurate as of: 06/08/15 11:17 AM.  Always use your most recent med list.               buPROPion 300 MG 24 hr tablet  Commonly known as:  WELLBUTRIN XL  Take 1 tablet (300 mg total) by mouth daily.     Exenatide ER 2 MG Pen  Inject 2 mg into the skin once a week.     hydrochlorothiazide 25 MG tablet  Commonly known as:  HYDRODIURIL  TAKE ONE TABLET BY MOUTH ONCE DAILY     meloxicam 15 MG tablet  Commonly known as:  MOBIC  Take 15 mg by mouth daily.     phentermine 37.5 MG tablet  Commonly known as:  ADIPEX-P  One tab by mouth qAM        Allergies:  Allergies  Allergen Reactions  . Codeine Nausea And Vomiting  . Hydromorphone Hcl Nausea And Vomiting    Past Medical History, Surgical history, Social history, and Family History were reviewed and updated.  Review of Systems: All other 10 point review of systems is negative.   Physical Exam:  height is 5\' 3"  (1.6  m) and weight is 193 lb (87.544 kg). Her oral temperature is 97.7 F (36.5 C). Her blood pressure is 133/93 and her pulse is 88. Her respiration is 18.   Wt Readings from Last 3 Encounters:  06/08/15 193 lb (87.544 kg)  05/18/15 192 lb (87.091 kg)  04/20/15 195 lb (88.451 kg)    Ocular: Sclerae unicteric, pupils equal, round and reactive to light Ear-nose-throat: Oropharynx clear, dentition fair Lymphatic: No cervical supraclavicular or axillary adenopathy Lungs no rales or rhonchi, good excursion bilaterally Heart regular rate and rhythm, no murmur appreciated Abd soft, tender in the left upper quadrant, positive bowel sounds, no liver or spleen tip palpated on exam, no fluid wave MSK no focal spinal tenderness, no joint edema Neuro: non-focal, well-oriented, appropriate affect Breasts: Deferred  Lab Results  Component Value Date   WBC 9.0 06/08/2015   HGB 11.9 06/08/2015   HCT 35.4 06/08/2015   MCV 93 06/08/2015   PLT 298 06/08/2015   Lab Results  Component Value Date   FERRITIN 330* 03/05/2015   IRON 64 03/05/2015   TIBC 259 03/05/2015   UIBC 195 03/05/2015   IRONPCTSAT 25 03/05/2015   Lab Results  Component Value Date   RETICCTPCT 1.2 01/17/2015  RBC 3.81 06/08/2015   RETICCTABS 48.7 01/17/2015   No results found for: KPAFRELGTCHN, LAMBDASER, KAPLAMBRATIO No results found for: IGGSERUM, IGA, IGMSERUM No results found for: Odetta Pink, SPEI   Chemistry      Component Value Date/Time   NA 137 02/17/2015 0454   K 3.5 02/17/2015 0454   CL 99* 02/17/2015 0454   CO2 30 02/17/2015 0454   BUN 17 02/17/2015 0454   CREATININE 0.95 02/17/2015 0454      Component Value Date/Time   CALCIUM 8.8* 02/17/2015 0454     Impression and Plan: Ms. Brunetti is a very pleasant 50 yo white female with iron deficiency anemia and a recent diagnosis of papillary renal cell carcinoma of the left kidney. She had a partial  nephrectomy on 02/15/15 and has recovered nicely. Her resection margins were negative. She has recuperated nicely and is now back to work. She is asymptomatic at this time.  Her WBC count is now 9. No anemia.  We will repeat a CT scan on her in 6 months.  We will plan to see her back in 3 months for labs and follow-up.  She will contact us with any questions or concerns. We can certainly see her sooner if need be.   Eliezer Bottom, NP 3/24/201711:17 AM

## 2015-06-12 ENCOUNTER — Telehealth: Payer: Self-pay

## 2015-06-12 MED ORDER — OSELTAMIVIR PHOSPHATE 75 MG PO CAPS: 75.0000 mg | ORAL_CAPSULE | Freq: Every day | ORAL | Status: DC

## 2015-06-12 NOTE — Telephone Encounter (Signed)
Excellent decision and thank you.

## 2015-06-12 NOTE — Telephone Encounter (Signed)
Patient aware.

## 2015-06-12 NOTE — Telephone Encounter (Signed)
[name] states her son was diagnosed with the flu. Sent in Tamiflu 75 mg once daily for 10 days.

## 2015-06-15 ENCOUNTER — Ambulatory Visit: Payer: BLUE CROSS/BLUE SHIELD | Admitting: Sports Medicine

## 2015-06-15 ENCOUNTER — Other Ambulatory Visit: Payer: Self-pay | Admitting: Sports Medicine

## 2015-06-22 ENCOUNTER — Ambulatory Visit (INDEPENDENT_AMBULATORY_CARE_PROVIDER_SITE_OTHER): Payer: BLUE CROSS/BLUE SHIELD | Admitting: Sports Medicine

## 2015-06-22 DIAGNOSIS — E669 Obesity, unspecified: Secondary | ICD-10-CM | POA: Diagnosis not present

## 2015-06-22 MED ORDER — PHENTERMINE HCL 37.5 MG PO TABS: ORAL_TABLET | ORAL | Status: DC

## 2015-06-22 MED ORDER — NALTREXONE-BUPROPION HCL ER 8-90 MG PO TB12: ORAL_TABLET | ORAL | Status: DC

## 2015-06-22 NOTE — Progress Notes (Signed)
  Subjective:    CC: Follow-up  HPI: Obesity: 2 pound weight loss, desires to stop injection, Topamax was intolerable.  Past medical history, Surgical history, Family history not pertinant except as noted below, Social history, Allergies, and medications have been entered into the medical record, reviewed, and no changes needed.   Review of Systems: No fevers, chills, night sweats, weight loss, chest pain, or shortness of breath.   Objective:    General: Well Developed, well nourished, and in no acute distress.  Neuro: Alert and oriented x3, extra-ocular muscles intact, sensation grossly intact.  HEENT: Normocephalic, atraumatic, pupils equal round reactive to light, neck supple, no masses, no lymphadenopathy, thyroid nonpalpable.  Skin: Warm and dry, no rashes. Cardiac: Regular rate and rhythm, no murmurs rubs or gallops, no lower extremity edema.  Respiratory: Clear to auscultation bilaterally. Not using accessory muscles, speaking in full sentences.  Impression and Recommendations:    I spent 25 minutes with this patient, greater than 50% was face-to-face time counseling regarding the above diagnoses

## 2015-06-22 NOTE — Assessment & Plan Note (Signed)
We are entering the third month, adding contrave. Continue bydureon, intolerant of Topamax. Return in one month. She is already taking Wellbutrin, if contrave is approved she will simply discontinue her existing Wellbutrin

## 2015-07-06 ENCOUNTER — Encounter: Payer: Self-pay | Admitting: Hematology & Oncology

## 2015-07-17 ENCOUNTER — Other Ambulatory Visit: Payer: Self-pay | Admitting: Sports Medicine

## 2015-07-20 ENCOUNTER — Encounter: Payer: Self-pay | Admitting: Sports Medicine

## 2015-07-20 ENCOUNTER — Ambulatory Visit (INDEPENDENT_AMBULATORY_CARE_PROVIDER_SITE_OTHER): Payer: BLUE CROSS/BLUE SHIELD | Admitting: Sports Medicine

## 2015-07-20 VITALS — BP 121/83 | HR 94 | Resp 18 | Wt 189.1 lb

## 2015-07-20 DIAGNOSIS — E669 Obesity, unspecified: Secondary | ICD-10-CM

## 2015-07-20 MED ORDER — NALTREXONE HCL 50 MG PO TABS: 25.0000 mg | ORAL_TABLET | Freq: Two times a day (BID) | ORAL | Status: DC

## 2015-07-20 MED ORDER — PHENTERMINE HCL 37.5 MG PO TABS: ORAL_TABLET | ORAL | Status: DC

## 2015-07-20 NOTE — Progress Notes (Signed)
  Subjective:    CC:  Follow-up  HPI:  obesity: good overall weight loss, only 1 pound since last month , we have finished 3 months so far phentermine. She is intolerant of Topamax, started bydureon, contrave was too expensive.   Discusses several stressors in her life including school and finances.  Past medical history, Surgical history, Family history not pertinant except as noted below, Social history, Allergies, and medications have been entered into the medical record, reviewed, and no changes needed.   Review of Systems: No fevers, chills, night sweats, weight loss, chest pain, or shortness of breath.   Objective:    General: Well Developed, well nourished, and in no acute distress.  Neuro: Alert and oriented x3, extra-ocular muscles intact, sensation grossly intact.  HEENT: Normocephalic, atraumatic, pupils equal round reactive to light, neck supple, no masses, no lymphadenopathy, thyroid nonpalpable.  Skin: Warm and dry, no rashes. Cardiac: Regular rate and rhythm, no murmurs rubs or gallops, no lower extremity edema.  Respiratory: Clear to auscultation bilaterally. Not using accessory muscles, speaking in full sentences.  Impression and Recommendations:    I spent 25 minutes with this patient, greater than 50% was face-to-face time counseling regarding the above diagnoses

## 2015-07-20 NOTE — Assessment & Plan Note (Signed)
One-pound weight loss after the third month of phentermine, entering the fourth month. Unable to afford Contrave, intolerant of Topamax and has stopped bydureon Continue Wellbutrin, adding generic naltrexone. Refilling phentermine. Return in one month

## 2015-08-24 ENCOUNTER — Ambulatory Visit (INDEPENDENT_AMBULATORY_CARE_PROVIDER_SITE_OTHER): Payer: BLUE CROSS/BLUE SHIELD | Admitting: Sports Medicine

## 2015-08-24 ENCOUNTER — Encounter: Payer: Self-pay | Admitting: Sports Medicine

## 2015-08-24 VITALS — BP 135/85 | HR 88 | Resp 18 | Wt 188.8 lb

## 2015-08-24 DIAGNOSIS — E669 Obesity, unspecified: Secondary | ICD-10-CM | POA: Diagnosis not present

## 2015-08-24 MED ORDER — PHENTERMINE HCL 37.5 MG PO TABS: ORAL_TABLET | ORAL | Status: DC

## 2015-08-24 NOTE — Progress Notes (Signed)
  Subjective:    CC: Weight check  HPI: 1 pound weight loss, we are entering the fifth month of phentermine. She continues with naltrexone twice a day and 300 mg of Wellbutrin daily.  Past medical history, Surgical history, Family history not pertinant except as noted below, Social history, Allergies, and medications have been entered into the medical record, reviewed, and no changes needed.   Review of Systems: No fevers, chills, night sweats, weight loss, chest pain, or shortness of breath.   Objective:    General: Well Developed, well nourished, and in no acute distress.  Neuro: Alert and oriented x3, extra-ocular muscles intact, sensation grossly intact.  HEENT: Normocephalic, atraumatic, pupils equal round reactive to light, neck supple, no masses, no lymphadenopathy, thyroid nonpalpable.  Skin: Warm and dry, no rashes. Cardiac: Regular rate and rhythm, no murmurs rubs or gallops, no lower extremity edema.  Respiratory: Clear to auscultation bilaterally. Not using accessory muscles, speaking in full sentences.  Impression and Recommendations:

## 2015-08-24 NOTE — Assessment & Plan Note (Signed)
One-pound weight loss after the fourth month of phentermine, continue Wellbutrin and naltrexone generic. Return in one month.

## 2015-08-27 ENCOUNTER — Other Ambulatory Visit: Payer: Self-pay | Admitting: Sports Medicine

## 2015-09-07 ENCOUNTER — Other Ambulatory Visit: Payer: BLUE CROSS/BLUE SHIELD

## 2015-09-07 ENCOUNTER — Ambulatory Visit: Payer: BLUE CROSS/BLUE SHIELD | Admitting: Hematology & Oncology

## 2015-09-21 ENCOUNTER — Ambulatory Visit: Payer: BLUE CROSS/BLUE SHIELD | Admitting: Sports Medicine

## 2015-10-01 ENCOUNTER — Other Ambulatory Visit: Payer: Self-pay | Admitting: Sports Medicine

## 2015-11-06 ENCOUNTER — Other Ambulatory Visit: Payer: Self-pay | Admitting: Sports Medicine

## 2015-12-07 ENCOUNTER — Other Ambulatory Visit: Payer: Self-pay | Admitting: Sports Medicine

## 2015-12-14 ENCOUNTER — Encounter: Payer: Self-pay | Admitting: Sports Medicine

## 2015-12-14 ENCOUNTER — Ambulatory Visit (INDEPENDENT_AMBULATORY_CARE_PROVIDER_SITE_OTHER): Payer: BLUE CROSS/BLUE SHIELD | Admitting: Sports Medicine

## 2015-12-14 VITALS — BP 119/79 | HR 87 | Resp 18 | Wt 191.6 lb

## 2015-12-14 DIAGNOSIS — R1030 Lower abdominal pain, unspecified: Secondary | ICD-10-CM

## 2015-12-14 DIAGNOSIS — Z85528 Personal history of other malignant neoplasm of kidney: Secondary | ICD-10-CM | POA: Diagnosis not present

## 2015-12-14 DIAGNOSIS — G4719 Other hypersomnia: Secondary | ICD-10-CM | POA: Diagnosis not present

## 2015-12-14 LAB — POCT URINALYSIS DIPSTICK
Bilirubin, UA: NEGATIVE
Blood, UA: NEGATIVE
Glucose, UA: NEGATIVE
Ketones, UA: NEGATIVE
Leukocytes, UA: NEGATIVE
Nitrite, UA: NEGATIVE
Protein, UA: NEGATIVE
Spec Grav, UA: 1.015
Urobilinogen, UA: 0.2
pH, UA: 7.5

## 2015-12-14 LAB — COMPREHENSIVE METABOLIC PANEL
Albumin: 4 g/dL (ref 3.6–5.1)
CO2: 28 mmol/L (ref 20–31)
Chloride: 102 mmol/L (ref 98–110)
Creat: 0.93 mg/dL (ref 0.50–1.10)
Potassium: 4.3 mmol/L (ref 3.5–5.3)
Sodium: 139 mmol/L (ref 135–146)
Total Protein: 6.9 g/dL (ref 6.1–8.1)

## 2015-12-14 LAB — CBC
HCT: 35.7 % (ref 35.0–45.0)
Hemoglobin: 11.9 g/dL (ref 11.7–15.5)
MCH: 30.1 pg (ref 27.0–33.0)
MCHC: 33.3 g/dL (ref 32.0–36.0)
MCV: 90.4 fL (ref 80.0–100.0)
MPV: 9.8 fL (ref 7.5–12.5)
Platelets: 362 10*3/uL (ref 140–400)
RBC: 3.95 MIL/uL (ref 3.80–5.10)
RDW: 13.1 % (ref 11.0–15.0)
WBC: 8.5 K/uL (ref 3.8–10.8)

## 2015-12-14 LAB — COMPREHENSIVE METABOLIC PANEL WITH GFR
ALT: 13 U/L (ref 6–29)
AST: 17 U/L (ref 10–35)
Alkaline Phosphatase: 79 U/L (ref 33–115)
BUN: 16 mg/dL (ref 7–25)
Calcium: 9.5 mg/dL (ref 8.6–10.2)
Glucose, Bld: 79 mg/dL (ref 65–99)
Total Bilirubin: 0.5 mg/dL (ref 0.2–1.2)

## 2015-12-14 LAB — VITAMIN B12: Vitamin B-12: 598 pg/mL (ref 200–1100)

## 2015-12-14 LAB — TSH: TSH: 2.59 mIU/L

## 2015-12-14 NOTE — Addendum Note (Signed)
Addended by: Elizabeth Sauer on: 12/14/2015 11:57 AM   Modules accepted: Orders

## 2015-12-14 NOTE — Assessment & Plan Note (Signed)
Fairly nonspecific symptoms. Ordering a sleep study, referral to sleep medicine, and checking some blood work.
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
## 2015-12-14 NOTE — Assessment & Plan Note (Signed)
Having some left upper quadrant pain, she is post left renal cell carcinoma and wedge nephrectomy. 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

## 2015-12-28 ENCOUNTER — Ambulatory Visit: Payer: BLUE CROSS/BLUE SHIELD | Admitting: Sports Medicine

## 2016-01-07 ENCOUNTER — Emergency Department (HOSPITAL_COMMUNITY): Payer: BLUE CROSS/BLUE SHIELD

## 2016-01-07 ENCOUNTER — Encounter (HOSPITAL_COMMUNITY): Payer: Self-pay | Admitting: Emergency Medicine

## 2016-01-07 ENCOUNTER — Telehealth: Payer: Self-pay

## 2016-01-07 ENCOUNTER — Emergency Department (HOSPITAL_COMMUNITY)
Admission: EM | Admit: 2016-01-07 | Discharge: 2016-01-07 | Disposition: A | Discharge status: Home / Self Care | Payer: BLUE CROSS/BLUE SHIELD | Attending: Emergency Medicine | Admitting: Emergency Medicine

## 2016-01-07 DIAGNOSIS — R319 Hematuria, unspecified: Secondary | ICD-10-CM | POA: Diagnosis not present

## 2016-01-07 DIAGNOSIS — Z79899 Other long term (current) drug therapy: Secondary | ICD-10-CM | POA: Diagnosis not present

## 2016-01-07 DIAGNOSIS — I10 Essential (primary) hypertension: Secondary | ICD-10-CM | POA: Insufficient documentation

## 2016-01-07 DIAGNOSIS — R079 Chest pain, unspecified: Secondary | ICD-10-CM | POA: Diagnosis not present

## 2016-01-07 DIAGNOSIS — R1013 Epigastric pain: Secondary | ICD-10-CM | POA: Diagnosis not present

## 2016-01-07 DIAGNOSIS — R109 Unspecified abdominal pain: Secondary | ICD-10-CM

## 2016-01-07 DIAGNOSIS — Z85528 Personal history of other malignant neoplasm of kidney: Secondary | ICD-10-CM

## 2016-01-07 LAB — I-STAT TROPONIN, ED: Troponin i, poc: 0.01 ng/mL (ref 0.00–0.08)

## 2016-01-07 LAB — URINALYSIS, ROUTINE W REFLEX MICROSCOPIC
Bilirubin Urine: NEGATIVE
Glucose, UA: NEGATIVE mg/dL
Hgb urine dipstick: NEGATIVE
KETONES UR: NEGATIVE mg/dL
NITRITE: NEGATIVE
PROTEIN: NEGATIVE mg/dL
Specific Gravity, Urine: 1.01 (ref 1.005–1.030)
pH: 6.5 (ref 5.0–8.0)

## 2016-01-07 LAB — CBC WITH DIFFERENTIAL/PLATELET
BASOS ABS: 0.1 10*3/uL (ref 0.0–0.1)
BASOS PCT: 1 %
EOS ABS: 0.4 10*3/uL (ref 0.0–0.7)
Eosinophils Relative: 4 %
HEMATOCRIT: 37.2 % (ref 36.0–46.0)
Hemoglobin: 12.1 g/dL (ref 12.0–15.0)
Lymphocytes Relative: 21 %
Lymphs Abs: 1.9 10*3/uL (ref 0.7–4.0)
MCH: 30.4 pg (ref 26.0–34.0)
MCHC: 32.5 g/dL (ref 30.0–36.0)
MCV: 93.5 fL (ref 78.0–100.0)
MONO ABS: 0.4 10*3/uL (ref 0.1–1.0)
Monocytes Relative: 5 %
NEUTROS ABS: 6.1 10*3/uL (ref 1.7–7.7)
NEUTROS PCT: 69 %
Platelets: 319 10*3/uL (ref 150–400)
RBC: 3.98 MIL/uL (ref 3.87–5.11)
RDW: 13.7 % (ref 11.5–15.5)
WBC: 8.9 10*3/uL (ref 4.0–10.5)

## 2016-01-07 LAB — COMPREHENSIVE METABOLIC PANEL
ALK PHOS: 92 U/L (ref 38–126)
ALT: 34 U/L (ref 14–54)
AST: 33 U/L (ref 15–41)
Albumin: 3.9 g/dL (ref 3.5–5.0)
Anion gap: 6 (ref 5–15)
BUN: 15 mg/dL (ref 6–20)
CHLORIDE: 102 mmol/L (ref 101–111)
CO2: 29 mmol/L (ref 22–32)
Calcium: 9.3 mg/dL (ref 8.9–10.3)
Creatinine, Ser: 0.92 mg/dL (ref 0.44–1.00)
GFR calc non Af Amer: >60 mL/min (ref >60)
GLUCOSE: 93 mg/dL (ref 65–99)
Potassium: 4 mmol/L (ref 3.5–5.1)
SODIUM: 137 mmol/L (ref 135–145)
TOTAL PROTEIN: 7.7 g/dL (ref 6.5–8.1)
Total Bilirubin: 0.7 mg/dL (ref 0.3–1.2)

## 2016-01-07 LAB — PREGNANCY, URINE: Preg Test, Ur: NEGATIVE

## 2016-01-07 LAB — TROPONIN I

## 2016-01-07 LAB — URINE MICROSCOPIC-ADD ON: RBC / HPF: NONE SEEN RBC/hpf (ref 0–5)

## 2016-01-07 LAB — LIPASE, BLOOD: LIPASE: 26 U/L (ref 11–51)

## 2016-01-07 MED ORDER — IOPAMIDOL (ISOVUE-300) INJECTION 61%
100.0000 mL | Freq: Once | INTRAVENOUS | Status: AC | PRN
  Administered 2016-01-07: 100 mL via INTRAVENOUS

## 2016-01-07 NOTE — Telephone Encounter (Signed)
Error-- pt's 01/10/2016 appt with Dr. Brett Fairy needs to be r/s.

## 2016-01-07 NOTE — Discharge Instructions (Addendum)
Please follow up with your primary care provider. Please avoid taking medications such as Focus Factor in the future. Discuss with your primary care provider before starting new medications.

## 2016-01-07 NOTE — ED Notes (Signed)
MD at bedside. 

## 2016-01-07 NOTE — Telephone Encounter (Signed)
I called pt to r/s her 01/11/16 appt because Dr. Brett Fairy cannot see pts that day. No answer, left a message asking her to call me back. If pt calls back, please get her rescheduled for a sleep consult with Dr. Brett Fairy.

## 2016-01-07 NOTE — ED Notes (Signed)
Patient walked to the bathroom with minimal assistance.  

## 2016-01-07 NOTE — ED Provider Notes (Signed)
[facility] DEPT Provider Note   CSN: [id number] Arrival date & time: 01/07/16  1104     History   Chief Complaint Chief Complaint  Patient presents with  . Abdominal Pain    HPI [name] is a 50 year old woman with history of HTN, left RCC s/p wedge nephrectomy in 2016.  HPI  She presents with abdominal pain. She had left arm pain this morning. Lasted about a minute. Self resolved. She had epigastric pain that started around 9AM. This pain radiated to her back. Felt like pressure. Lasted about 10 minutes. Resolving. She has residual soreness. No exacerbating factors. No relieving factors. She has chronic intermittent pain from her IBD but this feels higher. She also has an urge to defecate with her IBD flares but did not have that with this. Associated diaphoresis. Denies nausea. Denies dyspnea. No sick contacts. She thinks it might be stress related. She has an exam today. She took Focus Factor yesterday morning and this morning as well.   No fevers or chills. No cough. No exertional chest pain or dyspnea.  Colonscopy on 12/26/2014 normal. EGD was also normal. Biopsy of duodenum unremarkable.  Father had MI at 53. Maternal grandfather had MI at 17.   Past Medical History:  Diagnosis Date  . Abnormal Pap smear of cervix   . Anemia    history of iron deficiency-last 01-26-15. History of" chronic elevated white cell count"  . Hematuria    "blood in urine"  . Hypertension   . Pain in hip joint    left hip pain intermittent  . PPD positive, treated   . Renal cell carcinoma ([place])   . Wound, open    right index finger "injury" -remains -has wrapped with bandage.    Patient Active Problem List   Diagnosis Date Noted  . Excessive daytime sleepiness 12/14/2015  . History of renal cell carcinoma 02/15/2015  . Mass of left kidney 12/12/2014  . 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 Annual physical exam 10/19/2014  . Normocytic anemia 09/21/2014  .  Obesity 06/21/2014  . 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 CARPAL TUNNEL RELEASE Bilateral   . CERVICAL CONIZATION W/BX     "cervical cancer"  . CHOLECYSTECTOMY     laparoscopic  . FOOT SURGERY Right    "neuroma"  . HEEL SPUR SURGERY    . NASAL SINUS SURGERY    . ROBOTIC ASSITED PARTIAL NEPHRECTOMY Left 02/15/2015   Procedure: ROBOTIC ASSITED PARTIAL NEPHRECTOMY;  Surgeon: Raynelle Bring, MD;  Location: [facility];  Service: Urology;  Laterality: Left;    OB History    Gravida Para Term Preterm AB Living   2 1     1 1    SAB TAB Ectopic Multiple Live Births   1               Home Medications    Prior to Admission medications   Medication Sig Start Date End Date Taking? Authorizing Provider  buPROPion (WELLBUTRIN XL) 300 MG 24 hr tablet Take 1 tablet (300 mg total) by mouth daily. 12/12/14  Yes Silverio Decamp, MD  hydrochlorothiazide (HYDRODIURIL) 25 MG tablet TAKE ONE TABLET BY MOUTH ONCE DAILY 11/06/15  Yes Silverio Decamp, MD  meloxicam (MOBIC) 15 MG tablet TAKE ONE TABLET BY MOUTH ONCE DAILY 11/06/15  Yes Silverio Decamp, MD  naltrexone (DEPADE) 50 MG tablet Take 0.5 tablets (25 mg total) by  mouth 2 (two) times daily. 07/20/15  Yes Silverio Decamp, MD  OVER THE COUNTER MEDICATION Take 1 tablet by mouth daily.   Yes Historical Provider, MD  OVER THE COUNTER MEDICATION Take 1 tablet by mouth daily.   Yes Historical Provider, MD    Family History Family History  Problem Relation Age of Onset  . Heart disease Mother   . Heart disease Father     Social History Social History  Substance Use Topics  . Smoking status: Never Smoker  . Smokeless tobacco: Never Used  . Alcohol use No     Allergies   Codeine; Topamax [topiramate]; and Hydromorphone hcl   Review of Systems Review of Systems Constitutional: no fevers/chills Eyes: no vision changes Ears, nose, mouth, throat, and  face: no cough Respiratory: no shortness of breath Cardiovascular: +chest pain Gastrointestinal: no nausea/vomiting, +abdominal pain, no constipation, no diarrhea. Last BM yesterday evening.  Genitourinary: no dysuria, +hematuria Integument: no rash Hematologic/lymphatic: no bleeding/bruising, no edema Musculoskeletal: no arthralgias, no myalgias Neurological: no paresthesias, no weakness   Physical Exam Updated Vital Signs BP 122/84   Pulse 81   Temp 97.9 F (36.6 C) (Oral)   Resp 16   Ht 5\' 3"  (1.6 m)   Wt 88.5 kg   LMP 01/03/2016 (Exact Date)   SpO2 99%   BMI 34.54 kg/m   Physical Exam General Apperance: NAD Head: Normocephalic, atraumatic Eyes: PERRL, EOMI, anicteric sclera Ears: Normal external ear canal Nose: Nares normal, septum midline, mucosa normal Throat: Lips, mucosa and tongue normal  Neck: Supple, trachea midline Back: No tenderness or bony abnormality  Lungs: Clear to auscultation bilaterally. No wheezes, rhonchi or rales. Breathing comfortably Chest Wall: Nontender, no deformity Heart: Regular rate and rhythm, no murmur/rub/gallop Abdomen: Soft, nontender, nondistended, no rebound/guarding Extremities: Normal, atraumatic, warm and well perfused, no edema Pulses: 2+ throughout Skin: No rashes or lesions Neurologic: Alert and oriented x 3. CNII-XII intact. Normal strength and sensation  ED Treatments / Results  Labs (all labs ordered are listed, but only abnormal results are displayed) Labs Reviewed  URINALYSIS, ROUTINE W REFLEX MICROSCOPIC (NOT AT [facility]) - Abnormal; Notable for the following:       Result Value   Leukocytes, UA TRACE (*)    All other components within normal limits  URINE MICROSCOPIC-ADD ON - Abnormal; Notable for the following:    Squamous Epithelial / LPF 6-30 (*)    Bacteria, UA RARE (*)    All other components within normal limits  CBC WITH DIFFERENTIAL/PLATELET  COMPREHENSIVE METABOLIC PANEL  LIPASE, BLOOD  TROPONIN I    PREGNANCY, URINE  I-STAT TROPOININ, ED    EKG  EKG Interpretation  Date/Time:  Monday January 07 2016 11:13:20 EDT Ventricular Rate:  79 PR Interval:    QRS Duration: 90 QT Interval:  389 QTC Calculation: 446 R Axis:   45 Text Interpretation:  Sinus rhythm Normal ECG Confirmed by BEATON  MD, ROBERT ([id number]) on 01/07/2016 11:31:30 AM       Radiology Ct Abdomen Pelvis W Wo Contrast  Result Date: 01/07/2016 CLINICAL DATA:  Acute onset epigastric abdominal pain today. Previous partial nephrectomy for renal cell carcinoma. EXAM: CT ABDOMEN AND PELVIS WITHOUT AND WITH CONTRAST TECHNIQUE: Multidetector CT imaging of the abdomen and pelvis was performed following the standard protocol before and following the bolus administration of intravenous contrast. CONTRAST:  100 mL Isovue 300 COMPARISON:  04/25/2015 FINDINGS: Lower Chest: No acute findings. Hepatobiliary: No mass identified. Prior cholecystectomy noted. No evidence  of biliary dilatation. Pancreas:  No mass or inflammatory changes. Spleen: Within normal limits in size and appearance. Adrenals/Urinary Tract: Normal adrenal glands. No evidence of urolithiasis or hydronephrosis. Postop changes seen from previous partial left nephrectomy. No evidence of recurrent mass in the left renal surgical bed. No other renal masses identified. Unopacified urinary bladder is unremarkable in appearance. Stomach/Bowel: No evidence of obstruction, inflammatory process or abnormal fluid collections. Normal appendix visualized. Vascular/Lymphatic: No pathologically enlarged lymph nodes. No abdominal aortic aneurysm or dissection. Reproductive:  Uterus and adnexal regions are unremarkable. Other:  None. Musculoskeletal:  No suspicious bone lesions identified. IMPRESSION: No acute findings within the abdomen or pelvis. No evidence of recurrent or metastatic carcinoma. Electronically Signed   By: Earle Gell M.D.   On: 01/07/2016 15:57   Dg Chest Port 1  View  Result Date: 01/07/2016 CLINICAL DATA:  Chest pain/pressure on and off since this morning. No other complaints. Hx htn. EXAM: PORTABLE CHEST 1 VIEW COMPARISON:  01/10/2015 FINDINGS: The heart size and mediastinal contours are within normal limits. Both lungs are clear. No pleural effusion or pneumothorax. Skeletal structures are unremarkable. IMPRESSION: No active disease. Electronically Signed   By: Lajean Manes M.D.   On: 01/07/2016 12:49   Procedures   Medications Ordered in ED Medications  iopamidol (ISOVUE-300) 61 % injection 100 mL (100 mLs Intravenous Contrast Given 01/07/16 1512)     Initial Impression / Assessment and Plan / ED Course  I have reviewed the triage vital signs and the nursing notes.  Pertinent labs & imaging results that were available during my care of the patient were reviewed by me and considered in my medical decision making (see chart for details).  Clinical Course   50 year old woman with history of HTN, left RCC s/p wedge nephrectomy in 2016 presenting with epigastric/lower sternal pain this morning around 9 AM.   Final Clinical Impressions(s) / ED Diagnoses   Final diagnoses:  Epigastric pain  Chest pain, unspecified type  Nontender on palpation. CMP and lipase unremarkable. CBC unremarkable as well. Afebrile and no leukocytosis. EKG without acute ischemic changes. Troponin negative. CXR negative. Low risk for PE by Wells' score. UA and Urine preg unremarkable. No hgb on UA. Trace leukocytes with rare bacteria, 6-30 squamous epithelial cells. Likely not a clean catch sample. Second troponin 0.01 by POC. CT abdomen/pelvis w and w/o contrast with no acute findings. No evidence of recurrent or metastatic carcinoma. Will discharge her with follow up with her PCP.  New Prescriptions Current Discharge Medication List       Milagros Loll, MD 01/07/16 1647    Leonard Schwartz, MD 01/12/16 [phone]

## 2016-01-07 NOTE — ED Notes (Signed)
Patient transported to CT 

## 2016-01-07 NOTE — ED Triage Notes (Addendum)
Per EMS: PT has hx htn and kidney cancer that she was cleared for last night. Pt takes hctz for htn.  Pt had sudden onset of epigastric pain during class radiating to back with tingling and discomfort radiating down left arm, paleness. Pt also having headache.  Pt has similar episode when she had cancer.  Pain 8/10.  Pt also has been taking a medication over past 2 days to help her focus.  180/110 manual bp , cbg 107

## 2016-01-09 NOTE — Telephone Encounter (Signed)
I called pt again to reschedule her appt tomorrow with Dr. Brett Fairy. No answer, left a message asking her to call me back.

## 2016-01-10 ENCOUNTER — Other Ambulatory Visit: Payer: Self-pay | Admitting: Sports Medicine

## 2016-01-10 ENCOUNTER — Encounter: Payer: Self-pay | Admitting: Neurology

## 2016-01-10 ENCOUNTER — Ambulatory Visit (INDEPENDENT_AMBULATORY_CARE_PROVIDER_SITE_OTHER): Payer: BLUE CROSS/BLUE SHIELD | Admitting: Neurology

## 2016-01-10 VITALS — BP 132/86 | HR 72 | Resp 20 | Ht 63.0 in | Wt 202.0 lb

## 2016-01-10 DIAGNOSIS — G4730 Sleep apnea, unspecified: Secondary | ICD-10-CM

## 2016-01-10 DIAGNOSIS — G4726 Circadian rhythm sleep disorder, shift work type: Secondary | ICD-10-CM | POA: Diagnosis not present

## 2016-01-10 DIAGNOSIS — G4710 Hypersomnia, unspecified: Secondary | ICD-10-CM

## 2016-01-10 DIAGNOSIS — G47429 Narcolepsy in conditions classified elsewhere without cataplexy: Secondary | ICD-10-CM | POA: Diagnosis not present

## 2016-01-10 DIAGNOSIS — G4753 Recurrent isolated sleep paralysis: Secondary | ICD-10-CM | POA: Diagnosis not present

## 2016-01-10 MED ORDER — ARMODAFINIL 250 MG PO TABS: 250.0000 mg | ORAL_TABLET | Freq: Every day | ORAL | 5 refills | Status: DC
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
## 2016-01-10 NOTE — Patient Instructions (Addendum)
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 We may have to try different medications that may help you stay awake during the day. Not everything works with everybody the same way. Wake promoting agents include stimulants and non-stimulant type medications.  The most common side effects with stimulants are weight loss, insomnia, nervousness, headaches, palpitations, rise in blood pressure, anxiety. Stimulants can be addictive and subject to abuse. Non-stimulant type wake promoting medications include Provigil and Nuvigil, most common side effects include headaches, nervousness, insomnia, hypertension. In addition there is a medication called Xyrem which has been proven to be very effective in patients with narcolepsy with or without cataplexy.  Some patients with narcolepsy report episodes of weakness, such as jaw or facial weakness, legs giving out, feeling wobbly or like "Jell-o", etc. in situations of anxiety, stress, laughter, sudden sadness, surprise, etc., which is called cataplexy. You can also experience episodes of sleep paralysis during which you may feel unable to move upon awakening. Some people experience dreamlike sequences upon awakening or upon drifting off to sleep, called hypnopompic or hypnagogic hallucinations.  This is your new medication for alertness- please remember to reduce Wellbutrin to 150 mg for 7 days, than not taking Wellbutrin at all for at least 14 days before the sleep tests.   Modafinil is also discontinued 6 days prior to your sleep test/   Armodafinil tablets What is this medicine? ARMODAFINIL (ar moe DAF i nil) is used to treat excessive sleepiness caused by certain sleep disorders. This includes narcolepsy, sleep apnea, and shift work sleep disorder. This medicine may be used for other purposes; ask your  health care provider or pharmacist if you have questions. What should I tell my health care provider before I take this medicine? They need to know if you have any of these conditions: -kidney disease -liver disease -an unusual or allergic reaction to armodafinil, modafinil, medicines, foods, dyes, or preservatives -pregnant or trying to get pregnant -breast-feeding How should I use this medicine? Take this medicine by mouth with a glass of water. Follow the directions on the prescription label. Take your doses at regular intervals. Do not take your medicine more often than directed. Do not stop taking except on your doctor's advice. A special MedGuide will be given to you by the pharmacist with each prescription and refill. Be sure to read this information carefully each time. Talk to your pediatrician regarding the use of this medicine in children. While this drug may be prescribed for children as young as 45 years of age for selected conditions, precautions do apply. Overdosage: If you think you have taken too much of this medicine contact a poison control center or emergency room at once. NOTE: This medicine is only for you. Do not share this medicine with others. What if I miss a dose? If you miss a dose, take it as soon as you can. If it is almost time for your next dose, take only that dose. Do not take double or extra doses. What may interact with this medicine? Do not take this medicine with any of the following medications: -amphetamine or dextroamphetamine -dexmethylphenidate or methylphenidate -MAOIs like Carbex, Eldepryl, Marplan, Nardil, and Parnate -pemoline -procarbazine This medicine may also interact with the following medications: -antifungal medicines like itraconazole or ketoconazole -barbiturates, like phenobarbital -birth control pills or other hormone-containing birth control  devices or implants -carbamazepine -cyclosporine -diazepam -medicines for depression,  anxiety, or psychotic disturbances -phenytoin -propranolol -triazolam -warfarin This list may not describe all possible interactions. Give your health care provider a list of all the medicines, herbs, non-prescription drugs, or dietary supplements you use. Also tell them if you smoke, drink alcohol, or use illegal drugs. Some items may interact with your medicine. What should I watch for while using this medicine? Visit your doctor or health care professional for regular checks on your progress. The full effect of this medicine may not be seen right away. This medicine may affect your concentration, function, or may hide signs that you are tired. You may get dizzy. Do not drive, use machinery, or do anything that needs mental alertness until you know how this drug affects you. Alcohol can make you more dizzy and may interfere with your response to this medicine or your alertness. Avoid alcoholic drinks. Birth control pills may not work properly while you are taking this medicine. Talk to your doctor about using an extra method of birth control. It is unknown if the effects of this medicine will be increased by the use of caffeine. Caffeine is found in many foods, beverages, and medications. Ask your doctor if you should limit or change your intake of caffeine-containing products while on this medicine. What side effects may I notice from receiving this medicine? Side effects that you should report to your doctor or health care professional as soon as possible: -allergic reactions like skin rash, itching or hives, swelling of the face, lips, or tongue -breathing problems -chest pain -fast, irregular heartbeat -increased blood pressure, particularly if you have high blood pressure -mental problems -sore throat, fever, or chills -tremors -vomiting Side effects that usually do not require medical attention (report to your doctor or health care professional if they continue or are  bothersome): -headache -nausea, diarrhea, or stomach upset -nervousness -trouble sleeping This list may not describe all possible side effects. Call your doctor for medical advice about side effects. You may report side effects to FDA at 1-800-FDA-1088. Where should I keep my medicine? Keep out of the reach of children. Store at room temperature between 20 and 25 degrees C (68 and 77 degrees F). Throw away any unused medicine after the expiration date. NOTE: This sheet is a summary. It may not cover all possible information. If you have questions about this medicine, talk to your doctor, pharmacist, or health care provider.    2016, Elsevier/Gold Standard. (2013-11-22 14:57:07)

## 2016-02-18 ENCOUNTER — Other Ambulatory Visit: Payer: Self-pay | Admitting: Sports Medicine

## 2016-04-30 ENCOUNTER — Other Ambulatory Visit: Payer: Self-pay | Admitting: Sports Medicine

## 2016-05-25 ENCOUNTER — Other Ambulatory Visit: Payer: Self-pay | Admitting: Sports Medicine

## 2016-05-29 ENCOUNTER — Ambulatory Visit (INDEPENDENT_AMBULATORY_CARE_PROVIDER_SITE_OTHER): Payer: BLUE CROSS/BLUE SHIELD | Admitting: Sports Medicine

## 2016-05-29 ENCOUNTER — Encounter: Payer: Self-pay | Admitting: Sports Medicine

## 2016-05-29 DIAGNOSIS — M5136 Other intervertebral disc degeneration, lumbar region: Secondary | ICD-10-CM

## 2016-05-29 DIAGNOSIS — E6609 Other obesity due to excess calories: Secondary | ICD-10-CM | POA: Diagnosis not present

## 2016-05-29 DIAGNOSIS — M170 Bilateral primary osteoarthritis of knee: Secondary | ICD-10-CM

## 2016-05-29 DIAGNOSIS — M51369 Other intervertebral disc degeneration, lumbar region without mention of lumbar back pain or lower extremity pain: Secondary | ICD-10-CM | POA: Insufficient documentation

## 2016-05-29 NOTE — Assessment & Plan Note (Signed)
Repeat steroid injection, this time in the right knee. Return in one month.

## 2016-05-29 NOTE — Progress Notes (Signed)
  Subjective:    CC:  Follow-up  HPI: Right knee pain: Known knee osteoarthritis, desires interventional treatment today, pain is severe, localized at the joint line, no mechanical symptoms, no trauma.  Obesity: Has failed multiple weight loss medications, at this point agrees to proceed with bariatric surgery.  Lumbar degenerative disc disease: Was having back pain radiating down the right leg, she went to the emergency department unfortunately, she x-rays and steroids, no further management, pain is persistent, no bowel or bladder dysfunction or saddle numbness.  Past medical history:  Negative.  See flowsheet/record as well for more information.  Surgical history: Negative.  See flowsheet/record as well for more information.  Family history: Negative.  See flowsheet/record as well for more information.  Social history: Negative.  See flowsheet/record as well for more information.  Allergies, and medications have been entered into the medical record, reviewed, and no changes needed.   Review of Systems: No fevers, chills, night sweats, weight loss, chest pain, or shortness of breath.   Objective:    General: Well Developed, well nourished, and in no acute distress.  Neuro: Alert and oriented x3, extra-ocular muscles intact, sensation grossly intact.  HEENT: Normocephalic, atraumatic, pupils equal round reactive to light, neck supple, no masses, no lymphadenopathy, thyroid nonpalpable.  Skin: Warm and dry, no rashes. Cardiac: Regular rate and rhythm, no murmurs rubs or gallops, no lower extremity edema.  Respiratory: Clear to auscultation bilaterally. Not using accessory muscles, speaking in full sentences. Right Knee: Minimal swelling, pain at the medial joint line ROM normal in flexion and extension and lower leg rotation. Ligaments with solid consistent endpoints including ACL, PCL, LCL, MCL. Negative Mcmurray's and provocative meniscal tests. Non painful patellar  compression. Patellar and quadriceps tendons unremarkable. Hamstring and quadriceps strength is normal.  Procedure: Real-time Ultrasound Guided Injection of right knee Device: GE Logiq E  Verbal informed consent obtained.  Time-out conducted.  Noted no overlying erythema, induration, or other signs of local infection.  Skin prepped in a sterile fashion.  Local anesthesia: Topical Ethyl chloride.  With sterile technique and under real time ultrasound guidance:  1 mL Kenalog 40, 2 mL lidocaine, 2 mL bupivacaine injected easily  Completed without difficulty  Pain immediately resolved suggesting accurate placement of the medication.  Advised to call if fevers/chills, erythema, induration, drainage, or persistent bleeding.  Images permanently stored and available for review in the ultrasound unit.  Impression: Technically successful ultrasound guided injection.  Impression and Recommendations:    Obesity Insufficient weight loss or success with phentermine, Wellbutrin and naltrexone. At this point she is a candidate for bariatric surgery, referral to [facility] surgery for consideration of gastric sleeve.  Primary osteoarthritis of both knees Repeat steroid injection, this time in the right knee. Return in one month.  Lumbar degenerative disc disease X-rays at [facility] showed multilevel degenerative disc disease. Having some radicular pain. Rehabilitation exercises given and we will discuss this in further detail at the next visit.

## 2016-05-29 NOTE — Assessment & Plan Note (Signed)
Insufficient weight loss or success with phentermine, Wellbutrin and naltrexone. At this point she is a candidate for bariatric surgery, referral to [facility] surgery for consideration of gastric sleeve.

## 2016-05-29 NOTE — Assessment & Plan Note (Signed)
X-rays at [facility] showed multilevel degenerative disc disease. Having some radicular pain. Rehabilitation exercises given and we will discuss this in further detail at the next visit.

## 2016-06-23 ENCOUNTER — Telehealth: Payer: Self-pay

## 2016-06-23 NOTE — Telephone Encounter (Signed)
Called patient to schedule sleep study 3 times. Have not heard back.

## 2016-06-26 ENCOUNTER — Ambulatory Visit (INDEPENDENT_AMBULATORY_CARE_PROVIDER_SITE_OTHER): Payer: BLUE CROSS/BLUE SHIELD | Admitting: Sports Medicine

## 2016-06-26 ENCOUNTER — Encounter: Payer: Self-pay | Admitting: Sports Medicine

## 2016-06-26 DIAGNOSIS — E6609 Other obesity due to excess calories: Secondary | ICD-10-CM | POA: Diagnosis not present

## 2016-06-26 DIAGNOSIS — M51369 Other intervertebral disc degeneration, lumbar region without mention of lumbar back pain or lower extremity pain: Secondary | ICD-10-CM

## 2016-06-26 DIAGNOSIS — M170 Bilateral primary osteoarthritis of knee: Secondary | ICD-10-CM

## 2016-06-26 DIAGNOSIS — M5136 Other intervertebral disc degeneration, lumbar region: Secondary | ICD-10-CM | POA: Diagnosis not present

## 2016-06-26 MED ORDER — EXENATIDE ER 2 MG/0.85ML ~~LOC~~ AUIJ: 2.0000 mg | AUTO-INJECTOR | SUBCUTANEOUS | 11 refills | Status: DC

## 2016-06-26 NOTE — Progress Notes (Signed)
Subjective:    CC: Follow-up  HPI: Obesity: Never followed up with bariatric surgery, she has already failed most of the medications but does want to try Bydureon again.  Knee pain: Resolved after injection at the last visit.  Back pain: Localized over the right sacroiliac joint with radiation to the right lateral thigh. Occasionally gets numbness into the feet. She tells me everything has gotten significantly better since we had her do a home rehabilitation regimen.  Past medical history:  Negative.  See flowsheet/record as well for more information.  Surgical history: Negative.  See flowsheet/record as well for more information.  Family history: Negative.  See flowsheet/record as well for more information.  Social history: Negative.  See flowsheet/record as well for more information.  Allergies, and medications have been entered into the medical record, reviewed, and no changes needed.   Review of Systems: No fevers, chills, night sweats, weight loss, chest pain, or shortness of breath.   Objective:    General: Well Developed, well nourished, and in no acute distress.  Neuro: Alert and oriented x3, extra-ocular muscles intact, sensation grossly intact.  HEENT: Normocephalic, atraumatic, pupils equal round reactive to light, neck supple, no masses, no lymphadenopathy, thyroid nonpalpable.  Skin: Warm and dry, no rashes. Cardiac: Regular rate and rhythm, no murmurs rubs or gallops, no lower extremity edema.  Respiratory: Clear to auscultation bilaterally. Not using accessory muscles, speaking in full sentences. Back Exam:  Inspection: Unremarkable  Motion: Flexion 45 deg, Extension 45 deg, Side Bending to 45 deg bilaterally,  Rotation to 45 deg bilaterally  SLR laying: Negative  XSLR laying: Negative  Palpable tenderness: Right sacroiliac joint. FABER: negative. Sensory change: Gross sensation intact to all lumbar and sacral dermatomes.  Reflexes: 2+ at both patellar tendons, 2+  at achilles tendons, Babinski's downgoing.  Strength at foot  Plantar-flexion: 5/5 Dorsi-flexion: 5/5 Eversion: 5/5 Inversion: 5/5  Leg strength  Quad: 5/5 Hamstring: 5/5 Hip flexor: 5/5 Hip abductors: 5/5  Gait unremarkable.  Procedure: Real-time Ultrasound Guided Injection of right sacroiliac joint Device: GE Logiq E  Verbal informed consent obtained.  Time-out conducted.  Noted no overlying erythema, induration, or other signs of local infection.  Skin prepped in a sterile fashion.  Local anesthesia: Topical Ethyl chloride.  With sterile technique and under real time ultrasound guidance:  This was a very technically difficult procedure due to patient body habitus, I was able to drop the needle into the right sacroiliac joint after walking it along the dorsum of the sacrum, at that point I injected 1 mL Kenalog 40, 2 mL lidocaine, 2 mL bupivacaine. Completed without difficulty  Pain immediately resolved suggesting accurate placement of the medication.  Advised to call if fevers/chills, erythema, induration, drainage, or persistent bleeding.  Images permanently stored and available for review in the ultrasound unit.  Impression: Technically successful ultrasound guided injection.  Impression and Recommendations:    Lumbar degenerative disc disease Right SI joint injection as above. If this does not work we will get MRI of her lumbar spine. She did have what seemed to be multifactorial back pain referable to the SI joint as well as bilateral radicular pain. I did explain to her that weight loss would be paramount here.  Obesity Patient desires to try Bydureon again. I did explain that at this point we tried phentermine, Wellbutrin, naltrexone without much improvement and really was a good candidate for bariatric surgery.   Primary osteoarthritis of both knees Pain resolved after injection at the last visit,  right knee.

## 2016-06-26 NOTE — Assessment & Plan Note (Signed)
Patient desires to try Bydureon again. I did explain that at this point we tried phentermine, Wellbutrin, naltrexone without much improvement and really was a good candidate for bariatric surgery.

## 2016-06-26 NOTE — Assessment & Plan Note (Signed)
Pain resolved after injection at the last visit, right knee.

## 2016-06-26 NOTE — Assessment & Plan Note (Signed)
Right SI joint injection as above. If this does not work we will get MRI of her lumbar spine. She did have what seemed to be multifactorial back pain referable to the SI joint as well as bilateral radicular pain. I did explain to her that weight loss would be paramount here.

## 2016-07-23 ENCOUNTER — Ambulatory Visit: Payer: BLUE CROSS/BLUE SHIELD | Admitting: Sports Medicine

## 2016-08-17 ENCOUNTER — Other Ambulatory Visit: Payer: Self-pay | Admitting: Sports Medicine

## 2016-09-11 ENCOUNTER — Other Ambulatory Visit: Payer: Self-pay | Admitting: Sports Medicine

## 2016-11-29 ENCOUNTER — Other Ambulatory Visit: Payer: Self-pay | Admitting: Sports Medicine

## 2017-01-09 ENCOUNTER — Other Ambulatory Visit: Payer: Self-pay | Admitting: Sports Medicine

## 2017-03-26 IMAGING — CR DG CHEST 2V
2 series · 2 of 2 positions shown · non-contrast
Comparison: None.

CLINICAL DATA: Positive tuberculin skin test

EXAM:
CHEST  2 VIEW

[chest pa]
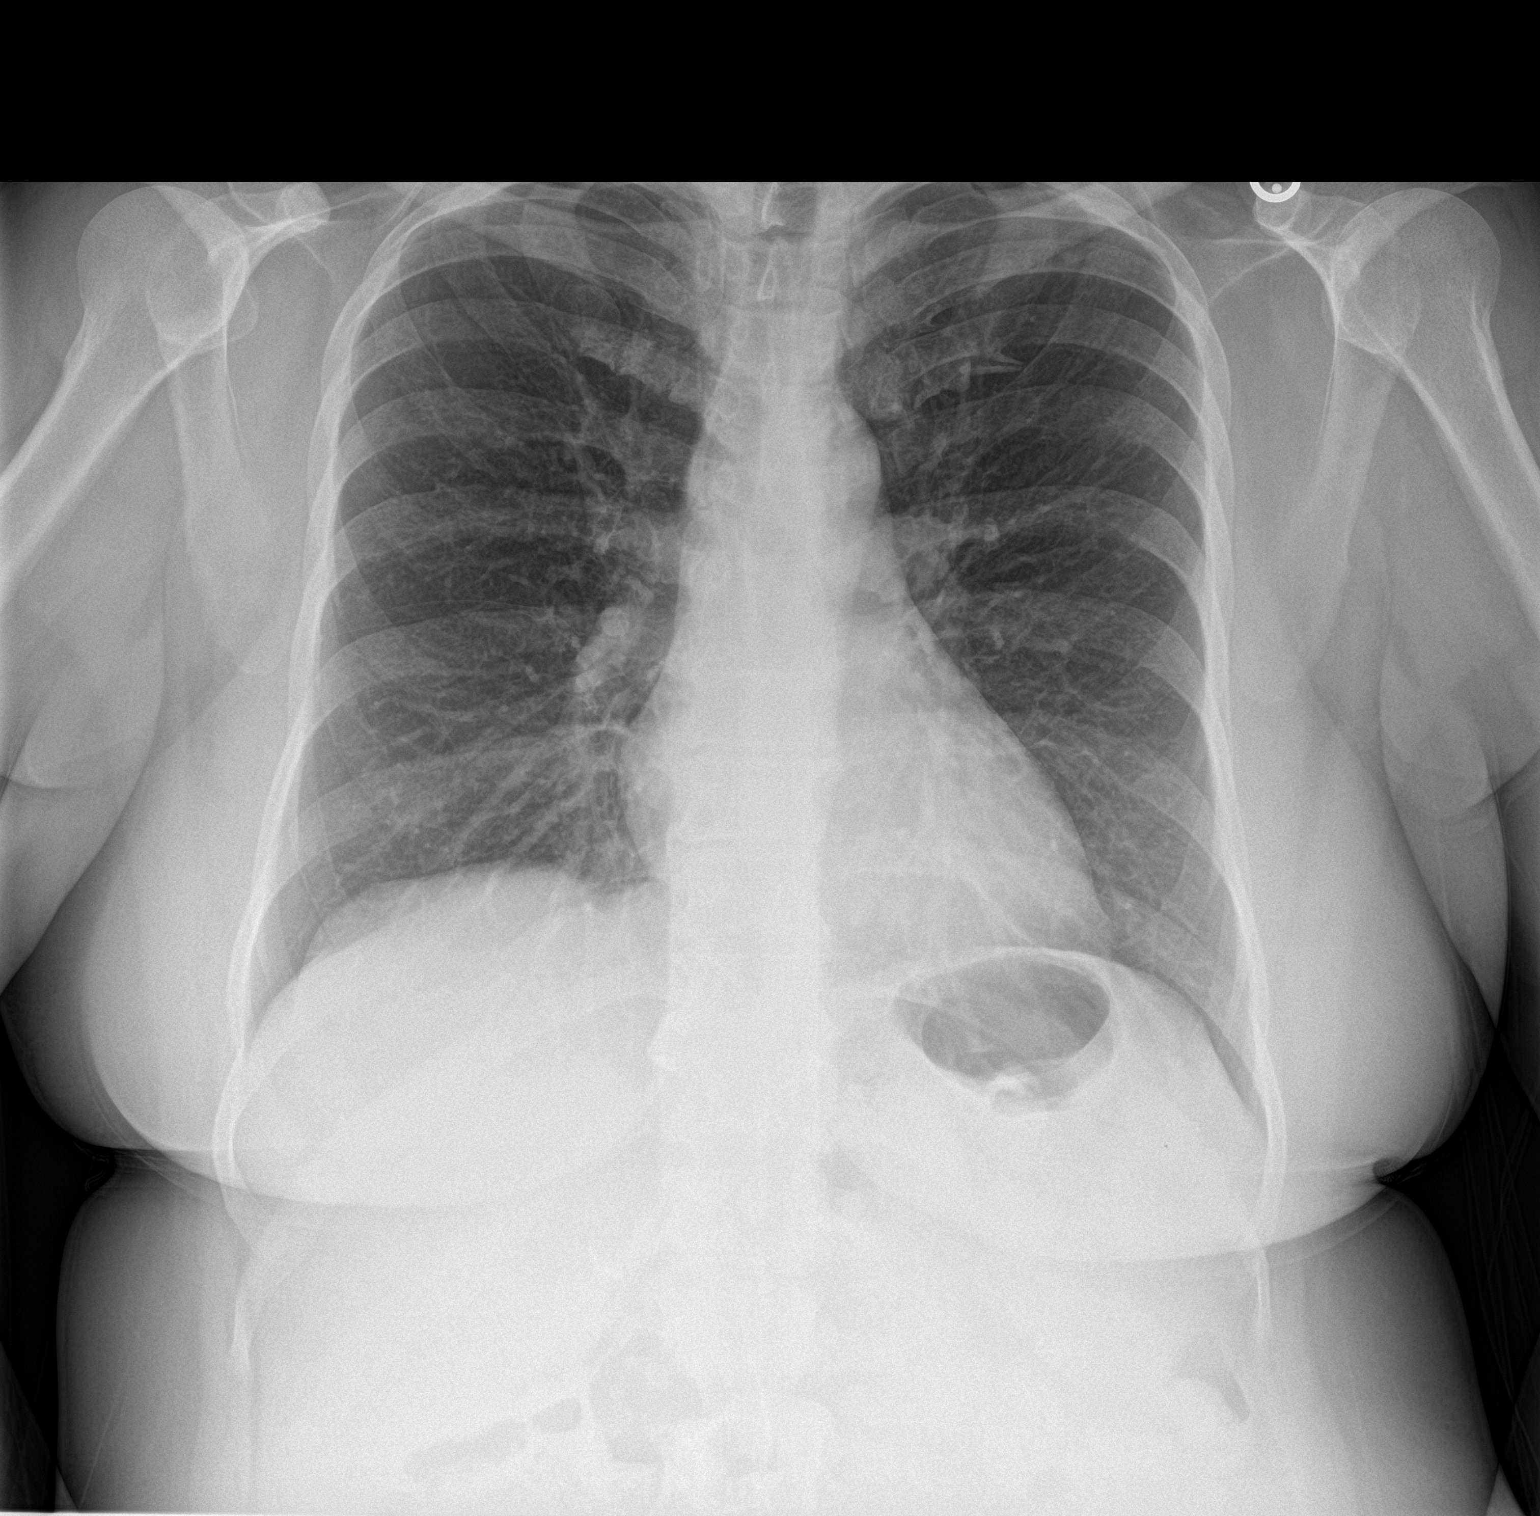

[chest lat]
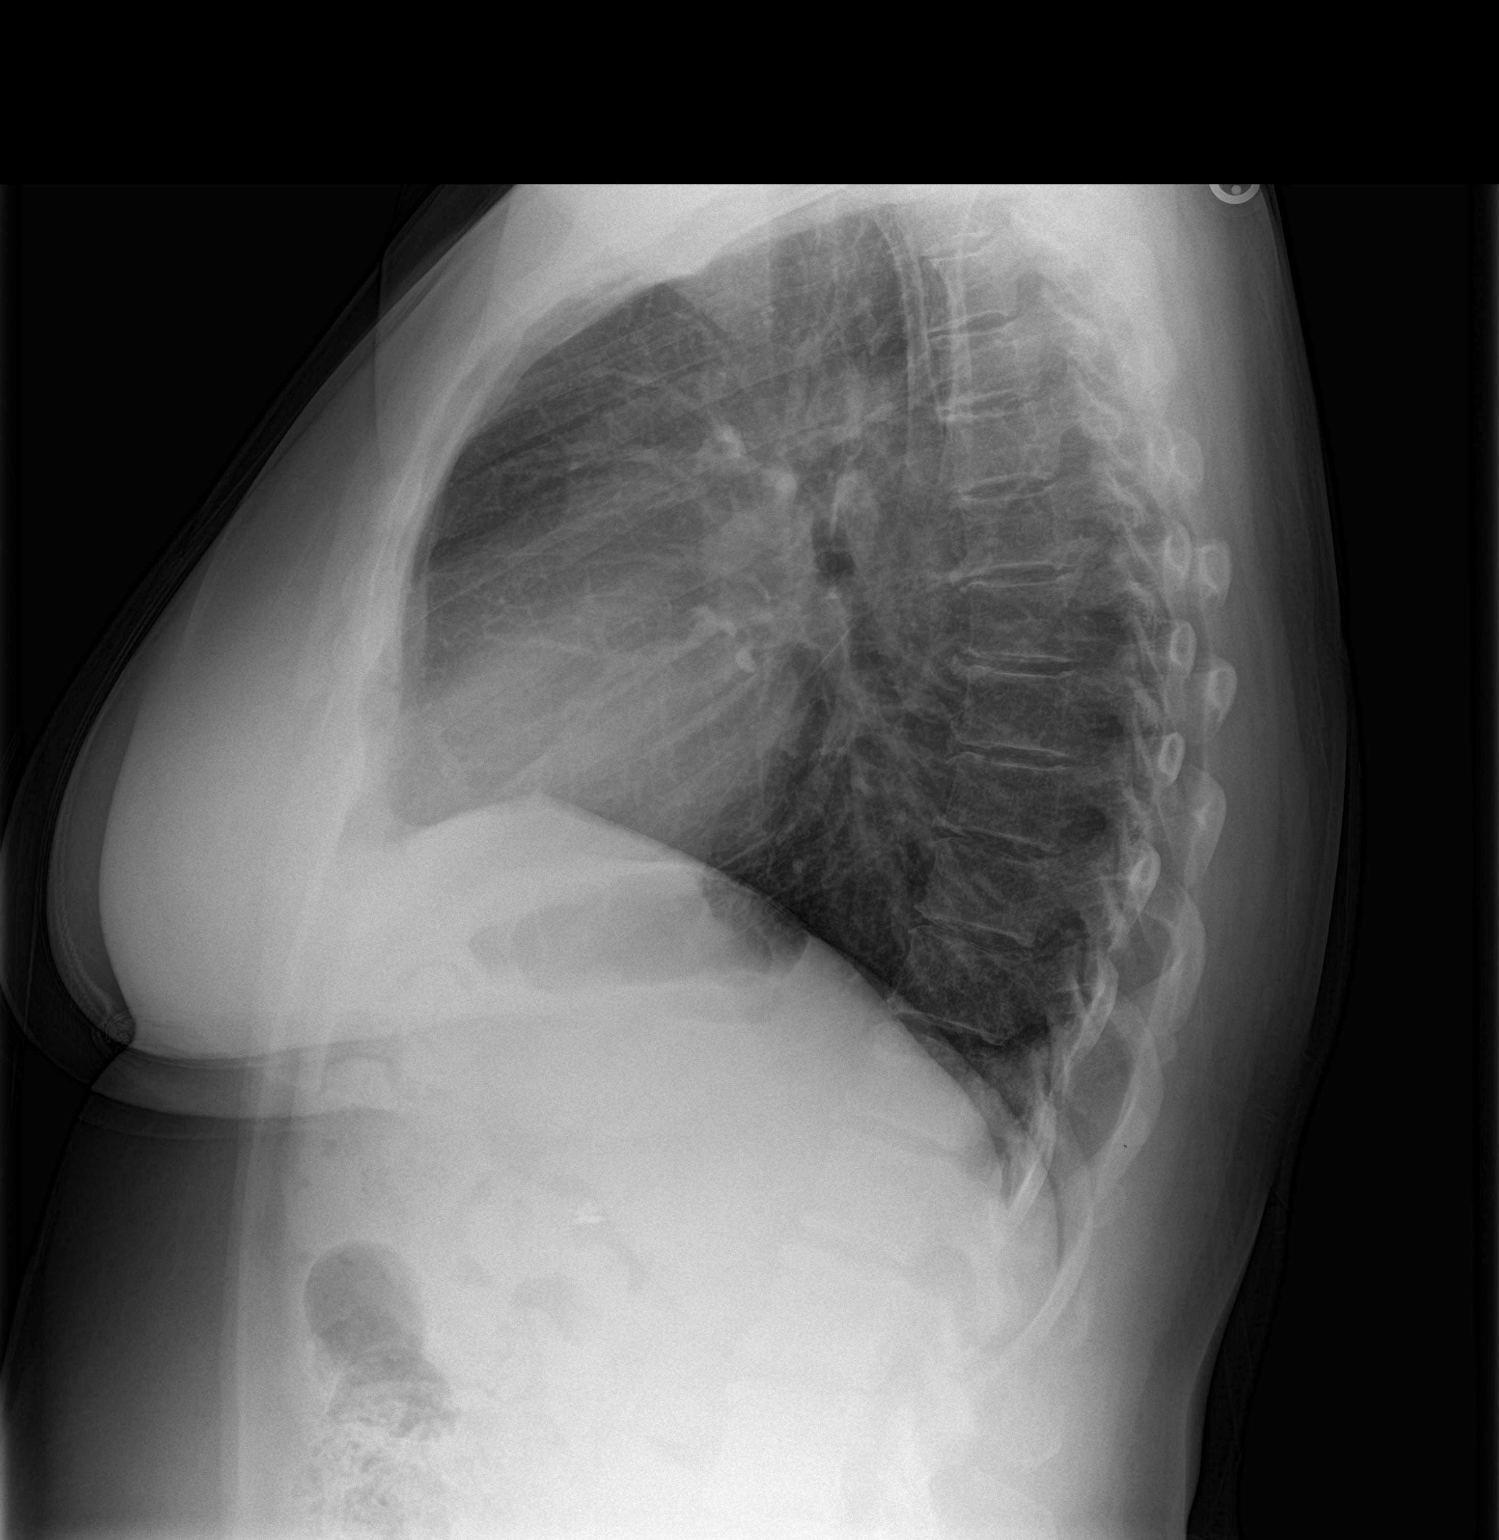

[2 of 2 positions shown; findings below may reference images not displayed]

FINDINGS: There is a 5 mm nodular opacity either in or overlying the left
apex. Lungs elsewhere clear. Heart size and pulmonary vascularity
are normal. No adenopathy. No bone lesions.
IMPRESSION: No edema or consolidation. 5 mm nodular opacity either in or
overlying the left apex. As there are no prior studies to compare, a
followup chest radiograph in 3 months to assess for stability
advised.

## 2017-04-11 ENCOUNTER — Other Ambulatory Visit: Payer: Self-pay | Admitting: Sports Medicine

## 2017-05-20 ENCOUNTER — Other Ambulatory Visit: Payer: Self-pay | Admitting: Sports Medicine

## 2017-05-27 ENCOUNTER — Other Ambulatory Visit: Payer: Self-pay | Admitting: Sports Medicine

## 2017-06-04 ENCOUNTER — Other Ambulatory Visit: Payer: Self-pay | Admitting: Sports Medicine

## 2017-08-11 ENCOUNTER — Other Ambulatory Visit: Payer: Self-pay | Admitting: Sports Medicine

## 2017-08-19 ENCOUNTER — Other Ambulatory Visit: Payer: Self-pay | Admitting: Sports Medicine

## 2017-11-03 ENCOUNTER — Other Ambulatory Visit: Payer: Self-pay | Admitting: Sports Medicine

## 2017-11-11 ENCOUNTER — Other Ambulatory Visit: Payer: Self-pay | Admitting: Sports Medicine

## 2017-12-08 ENCOUNTER — Other Ambulatory Visit: Payer: Self-pay | Admitting: Sports Medicine

## 2018-04-19 ENCOUNTER — Encounter: Payer: Self-pay | Admitting: Sports Medicine

## 2018-04-19 ENCOUNTER — Ambulatory Visit: Payer: PRIVATE HEALTH INSURANCE | Admitting: Sports Medicine

## 2018-04-19 DIAGNOSIS — I10 Essential (primary) hypertension: Secondary | ICD-10-CM | POA: Diagnosis not present

## 2018-04-19 DIAGNOSIS — E6609 Other obesity due to excess calories: Secondary | ICD-10-CM

## 2018-04-19 DIAGNOSIS — M51369 Other intervertebral disc degeneration, lumbar region without mention of lumbar back pain or lower extremity pain: Secondary | ICD-10-CM

## 2018-04-19 DIAGNOSIS — Z0000 Encounter for general adult medical examination without abnormal findings: Secondary | ICD-10-CM

## 2018-04-19 DIAGNOSIS — M5136 Other intervertebral disc degeneration, lumbar region: Secondary | ICD-10-CM

## 2018-04-19 DIAGNOSIS — D649 Anemia, unspecified: Secondary | ICD-10-CM

## 2018-04-19 MED ORDER — SEMAGLUTIDE(0.25 OR 0.5MG/DOS) 2 MG/1.5ML ~~LOC~~ SOPN: 0.5000 mg | PEN_INJECTOR | SUBCUTANEOUS | 11 refills | Status: DC

## 2018-04-19 MED ORDER — LISINOPRIL-HYDROCHLOROTHIAZIDE 20-25 MG PO TABS: 0.5000 | ORAL_TABLET | Freq: Every day | ORAL | 3 refills | Status: DC

## 2018-04-19 MED ORDER — METFORMIN HCL 500 MG PO TABS: 500.0000 mg | ORAL_TABLET | Freq: Two times a day (BID) | ORAL | 3 refills | Status: DC

## 2018-04-19 MED ORDER — MELOXICAM 15 MG PO TABS: 15.0000 mg | ORAL_TABLET | Freq: Every day | ORAL | 0 refills | Status: DC

## 2018-04-19 MED ORDER — SEMAGLUTIDE (1 MG/DOSE) 2 MG/1.5ML ~~LOC~~ SOPN: 1.0000 mg | PEN_INJECTOR | SUBCUTANEOUS | 11 refills | Status: DC

## 2018-04-19 NOTE — Assessment & Plan Note (Signed)
Starting lisinopril/HCTZ.

## 2018-04-19 NOTE — Progress Notes (Addendum)
Subjective:    CC: Reestablish care  HPI: Hypertension: Uncontrolled, no headaches, visual changes, chest pain.  Screening measures: Due for mammogram, labs, up-to-date on colon cancer screening.  Up-to-date on vaccinations.  Obesity: Would like to discuss weight loss treatment again.  She is still somewhat resistant to discussion of bariatric surgery.  Widespread aches and pains: Would like to restart meloxicam.  I reviewed the past medical history, family history, social history, surgical history, and allergies today and no changes were needed.  Please see the problem list section below in epic for further details.  Past Medical History: Past Medical History:  Diagnosis Date  . Abnormal Pap smear of cervix   . Anemia    history of iron deficiency-last 01-26-15. History of" chronic elevated white cell count"  . Hematuria    "blood in urine"  . Hypertension   . Pain in hip joint    left hip pain intermittent  . PPD positive, treated   . Renal cell carcinoma ([place])   . Wound, open    right index finger "injury" -remains -has wrapped with bandage.   Past Surgical History: Past Surgical History:  Procedure Laterality Date  . CARPAL TUNNEL RELEASE Bilateral   . CERVICAL CONIZATION W/BX     "cervical cancer"  . CHOLECYSTECTOMY     laparoscopic  . FOOT SURGERY Right    "neuroma"  . HEEL SPUR SURGERY    . NASAL SINUS SURGERY    . ROBOTIC ASSITED PARTIAL NEPHRECTOMY Left 02/15/2015   Procedure: ROBOTIC ASSITED PARTIAL NEPHRECTOMY;  Surgeon: Raynelle Bring, MD;  Location: [facility];  Service: Urology;  Laterality: Left;   Social History: Social History   Socioeconomic History  . Marital status: Married    Spouse name: Not on file  . Number of children: Not on file  . Years of education: Not on file  . Highest education level: Not on file  Occupational History  . Occupation: CNA  Social Needs  . Financial resource strain: Not on file  . Food insecurity:    Worry: Not on  file    Inability: Not on file  . Transportation needs:    Medical: Not on file    Non-medical: Not on file  Tobacco Use  . Smoking status: Never Smoker  . Smokeless tobacco: Never Used  Substance and Sexual Activity  . Alcohol use: No    Alcohol/week: 0.0 standard drinks  . Drug use: No  . Sexual activity: Yes    Partners: Male    Comment: vasectomy  Lifestyle  . Physical activity:    Days per week: Not on file    Minutes per session: Not on file  . Stress: Not on file  Relationships  . Social connections:    Talks on phone: Not on file    Gets together: Not on file    Attends religious service: Not on file    Active member of club or organization: Not on file    Attends meetings of clubs or organizations: Not on file    Relationship status: Not on file  Other Topics Concern  . Not on file  Social History Narrative  . Not on file   Family History: Family History  Problem Relation Age of Onset  . Heart disease Mother   . Heart disease Father    Allergies: Allergies  Allergen Reactions  . Codeine Nausea And Vomiting  . Topamax [Topiramate] Other (See Comments)    Intolerant even after 2 weeks  .  Hydromorphone Hcl Nausea And Vomiting   Medications: See med rec.  Review of Systems: No fevers, chills, night sweats, weight loss, chest pain, or shortness of breath.   Objective:    General: Well Developed, well nourished, and in no acute distress.  Neuro: Alert and oriented x3, extra-ocular muscles intact, sensation grossly intact.  HEENT: Normocephalic, atraumatic, pupils equal round reactive to light, neck supple, no masses, no lymphadenopathy, thyroid nonpalpable.  Skin: Warm and dry, no rashes. Cardiac: Regular rate and rhythm, no murmurs rubs or gallops, no lower extremity edema.  Respiratory: Clear to auscultation bilaterally. Not using accessory muscles, speaking in full sentences.  Impression and Recommendations:    Annual physical exam Up-to-date on  colon cancer screening. Starting breast cancer screening, checking routine labs.   Obesity Starting metformin, Ozempic. She does have metabolic syndrome.  Lumbar degenerative disc disease Refilling meloxicam. She did well after a right sacroiliac joint injection in April 2018.  Benign essential hypertension Starting lisinopril/HCTZ.  Normocytic anemia Recurrence of normocytic anemia. This did well initially with iron supplementation. The patient has endorsed significant pica. Adding anemia panels.  Labs do show iron deficiency anemia, she needs to start iron sulfate 3 times daily.  May use Colace for constipation. ___________________________________________ Gwen Her. Dianah Field, M.D., ABFM., CAQSM. Primary Care and Sports Medicine [facility]  Adjunct Professor of [employer] [employer]

## 2018-04-19 NOTE — Assessment & Plan Note (Signed)
Up-to-date on colon cancer screening. Starting breast cancer screening, checking routine labs.

## 2018-04-19 NOTE — Assessment & Plan Note (Signed)
Starting metformin, Ozempic. She does have metabolic syndrome.

## 2018-04-19 NOTE — Assessment & Plan Note (Signed)
Refilling meloxicam. She did well after a right sacroiliac joint injection in April 2018.

## 2018-04-20 LAB — HEMOGLOBIN A1C
Hgb A1c MFr Bld: 5.7 % of total Hgb — ABNORMAL HIGH (ref <5.7)
Mean Plasma Glucose: 117 (calc)
eAG (mmol/L): 6.5 (calc)

## 2018-04-20 LAB — COMPREHENSIVE METABOLIC PANEL
AG Ratio: 1.1 (calc) (ref 1.0–2.5)
ALT: 14 U/L (ref 6–29)
AST: 16 U/L (ref 10–35)
Alkaline phosphatase (APISO): 105 U/L (ref 37–153)
CO2: 27 mmol/L (ref 20–32)
Calcium: 9.2 mg/dL (ref 8.6–10.4)
Glucose, Bld: 85 mg/dL (ref 65–99)
Total Bilirubin: 0.3 mg/dL (ref 0.2–1.2)

## 2018-04-20 LAB — LIPID PANEL W/REFLEX DIRECT LDL
Cholesterol: 215 mg/dL — ABNORMAL HIGH (ref <200)
HDL: 61 mg/dL (ref >=50)
LDL Cholesterol (Calc): 129 mg/dL (calc) — ABNORMAL HIGH
Non-HDL Cholesterol (Calc): 154 mg/dL — ABNORMAL HIGH (ref <130)
Total CHOL/HDL Ratio: 3.5 (calc) (ref <5.0)
Triglycerides: 136 mg/dL (ref <150)

## 2018-04-20 LAB — CBC
HCT: 33.6 % — ABNORMAL LOW (ref 35.0–45.0)
Hemoglobin: 11 g/dL — ABNORMAL LOW (ref 11.7–15.5)
MCH: 27.6 pg (ref 27.0–33.0)
MCHC: 32.7 g/dL (ref 32.0–36.0)
MCV: 84.2 fL (ref 80.0–100.0)
MPV: 10.8 fL (ref 7.5–12.5)
Platelets: 353 10*3/uL (ref 140–400)
RBC: 3.99 Million/uL (ref 3.80–5.10)
RDW: 14.3 % (ref 11.0–15.0)
WBC: 10.3 10*3/uL (ref 3.8–10.8)

## 2018-04-20 LAB — RETICULOCYTES
ABS Retic: 64000 cells/uL (ref 20000–8000)
Retic Ct Pct: 1.6 %

## 2018-04-20 LAB — COMPREHENSIVE METABOLIC PANEL WITH GFR
Albumin: 3.7 g/dL (ref 3.6–5.1)
BUN: 12 mg/dL (ref 7–25)
Chloride: 106 mmol/L (ref 98–110)
Creat: 0.78 mg/dL (ref 0.50–1.05)
Globulin: 3.5 g/dL (ref 1.9–3.7)
Potassium: 4.9 mmol/L (ref 3.5–5.3)
Sodium: 139 mmol/L (ref 135–146)
Total Protein: 7.2 g/dL (ref 6.1–8.1)

## 2018-04-20 LAB — B12 AND FOLATE PANEL
Folate: 5.9 ng/mL
Vitamin B-12: 1087 pg/mL (ref 200–1100)

## 2018-04-20 LAB — HIV ANTIBODY (ROUTINE TESTING W REFLEX): HIV 1&2 Ab, 4th Generation: NONREACTIVE

## 2018-04-20 LAB — TEST AUTHORIZATION

## 2018-04-20 LAB — TSH: TSH: 3.38 m[IU]/L

## 2018-04-20 NOTE — Assessment & Plan Note (Addendum)
Recurrence of normocytic anemia. This did well initially with iron supplementation. The patient has endorsed significant pica. Adding anemia panels.  Labs do show iron deficiency anemia, she needs to start iron sulfate 3 times daily.  May use Colace for constipation.

## 2018-04-20 NOTE — Addendum Note (Signed)
Addended by: Silverio Decamp on: 04/20/2018 09:12 AM   Modules accepted: Orders

## 2018-04-22 LAB — TEST AUTHORIZATION: TEST CODE:: 5616

## 2018-04-22 LAB — IRON,TIBC AND FERRITIN PANEL
%SAT: 11 % (calc) — ABNORMAL LOW (ref 16–45)
Ferritin: 17 ng/mL (ref 16–232)
Iron: 44 ug/dL — ABNORMAL LOW (ref 45–160)
TIBC: 392 mcg/dL (calc) (ref 250–450)

## 2018-04-22 MED ORDER — FERROUS SULFATE 325 (65 FE) MG PO TBEC: 325.0000 mg | DELAYED_RELEASE_TABLET | Freq: Three times a day (TID) | ORAL | 11 refills | Status: DC

## 2018-04-22 NOTE — Addendum Note (Signed)
Addended by: Silverio Decamp on: 04/22/2018 10:05 AM   Modules accepted: Orders

## 2018-04-28 ENCOUNTER — Ambulatory Visit (INDEPENDENT_AMBULATORY_CARE_PROVIDER_SITE_OTHER): Payer: PRIVATE HEALTH INSURANCE

## 2018-04-28 DIAGNOSIS — Z1231 Encounter for screening mammogram for malignant neoplasm of breast: Secondary | ICD-10-CM

## 2018-05-03 ENCOUNTER — Encounter: Payer: Self-pay | Admitting: Sports Medicine

## 2018-05-03 ENCOUNTER — Ambulatory Visit: Payer: PRIVATE HEALTH INSURANCE | Admitting: Sports Medicine

## 2018-05-03 DIAGNOSIS — E6609 Other obesity due to excess calories: Secondary | ICD-10-CM | POA: Diagnosis not present

## 2018-05-03 DIAGNOSIS — I10 Essential (primary) hypertension: Secondary | ICD-10-CM

## 2018-05-03 DIAGNOSIS — D649 Anemia, unspecified: Secondary | ICD-10-CM | POA: Diagnosis not present

## 2018-05-03 NOTE — Assessment & Plan Note (Signed)
Well-controlled now. Rechecking renal function, CK levels that she is having some myalgias.

## 2018-05-03 NOTE — Progress Notes (Addendum)
Subjective:    CC: Follow-up  HPI: Hypertension: Controlled, she is feeling a bit fatigued.  Obesity: 6 pound weight loss on the first 2 weeks of Ozempic.  We got this covered under the diagnosis of metabolic syndrome.  Iron deficiency anemia: Has been doing a single daily over-the-counter Flintstones vitamin with iron.  In the past she has ended up in the ED with severe constipation secondary to iron supplementation.  Ultimately she ended up needing to have iron infusions at the cancer center.  I reviewed the past medical history, family history, social history, surgical history, and allergies today and no changes were needed.  Please see the problem list section below in epic for further details.  Past Medical History: Past Medical History:  Diagnosis Date  . Abnormal Pap smear of cervix   . Anemia    history of iron deficiency-last 01-26-15. History of" chronic elevated white cell count"  . Hematuria    "blood in urine"  . Hypertension   . Pain in hip joint    left hip pain intermittent  . PPD positive, treated   . Renal cell carcinoma ([place])   . Wound, open    right index finger "injury" -remains -has wrapped with bandage.   Past Surgical History: Past Surgical History:  Procedure Laterality Date  . CARPAL TUNNEL RELEASE Bilateral   . CERVICAL CONIZATION W/BX     "cervical cancer"  . CHOLECYSTECTOMY     laparoscopic  . FOOT SURGERY Right    "neuroma"  . HEEL SPUR SURGERY    . NASAL SINUS SURGERY    . ROBOTIC ASSITED PARTIAL NEPHRECTOMY Left 02/15/2015   Procedure: ROBOTIC ASSITED PARTIAL NEPHRECTOMY;  Surgeon: Raynelle Bring, MD;  Location: [facility];  Service: Urology;  Laterality: Left;   Social History: Social History   Socioeconomic History  . Marital status: Married    Spouse name: Not on file  . Number of children: Not on file  . Years of education: Not on file  . Highest education level: Not on file  Occupational History  . Occupation: CNA  Social Needs    . Financial resource strain: Not on file  . Food insecurity:    Worry: Not on file    Inability: Not on file  . Transportation needs:    Medical: Not on file    Non-medical: Not on file  Tobacco Use  . Smoking status: Never Smoker  . Smokeless tobacco: Never Used  Substance and Sexual Activity  . Alcohol use: No    Alcohol/week: 0.0 standard drinks  . Drug use: No  . Sexual activity: Yes    Partners: Male    Comment: vasectomy  Lifestyle  . Physical activity:    Days per week: Not on file    Minutes per session: Not on file  . Stress: Not on file  Relationships  . Social connections:    Talks on phone: Not on file    Gets together: Not on file    Attends religious service: Not on file    Active member of club or organization: Not on file    Attends meetings of clubs or organizations: Not on file    Relationship status: Not on file  Other Topics Concern  . Not on file  Social History Narrative  . Not on file   Family History: Family History  Problem Relation Age of Onset  . Heart disease Mother   . Heart disease Father    Allergies: Allergies  Allergen  Reactions  . Codeine Nausea And Vomiting  . Topamax [Topiramate] Other (See Comments)    Intolerant even after 2 weeks  . Hydromorphone Hcl Nausea And Vomiting   Medications: See med rec.  Review of Systems: No fevers, chills, night sweats, weight loss, chest pain, or shortness of breath.   Objective:    General: Well Developed, well nourished, and in no acute distress.  Neuro: Alert and oriented x3, extra-ocular muscles intact, sensation grossly intact.  HEENT: Normocephalic, atraumatic, pupils equal round reactive to light, neck supple, no masses, no lymphadenopathy, thyroid nonpalpable.  Skin: Warm and dry, no rashes. Cardiac: Regular rate and rhythm, no murmurs rubs or gallops, no lower extremity edema.  Respiratory: Clear to auscultation bilaterally. Not using accessory muscles, speaking in full  sentences.  Impression and Recommendations:    Obesity Patient paid $0 for Ozempic, continues with metformin. She has already lost 6 pounds.  Normocytic anemia Labs continue to show iron deficiency anemia. She has been doing a single daily Flintstones iron. Development there is no to be a lot of difference will be happy to recheck her iron indices. In the past we used iron sulfate, this constipated her so much that she ended up in the ED. We did end up having to use iron infusions at [facility]. Rechecking today, referral for IV iron treatment if persistently iron deficient.  CBC has actually worsened, hemoglobin is slightly lower, iron saturation is only slightly better.  I think it is time to reconsider infusions, referral back to Dr. Marin Olp at [facility].  Her last colonoscopy and EGD were at the end of 2017.  Benign essential hypertension Well-controlled now. Rechecking renal function, CK levels that she is having some myalgias. ___________________________________________ Gwen Her. Dianah Field, M.D., ABFM., CAQSM. Primary Care and Sports Medicine [facility]  Adjunct Professor of [place] [employer] [facility] [employer]

## 2018-05-03 NOTE — Assessment & Plan Note (Signed)
Patient paid $0 for Ozempic, continues with metformin. She has already lost 6 pounds.

## 2018-05-03 NOTE — Assessment & Plan Note (Addendum)
Labs continue to show iron deficiency anemia. She has been doing a single daily Flintstones iron. Development there is no to be a lot of difference will be happy to recheck her iron indices. In the past we used iron sulfate, this constipated her so much that she ended up in the ED. We did end up having to use iron infusions at [facility] [facility]. Rechecking today, referral for IV iron treatment if persistently iron deficient.  CBC has actually worsened, hemoglobin is slightly lower, iron saturation is only slightly better.  I think it is time to reconsider infusions, referral back to Dr. Marin Olp at [facility].  Her last colonoscopy and EGD were at the end of 2017.

## 2018-05-04 LAB — CK: Total CK: 75 U/L (ref 29–143)

## 2018-05-04 LAB — CBC
HCT: 33.3 % — ABNORMAL LOW (ref 35.0–45.0)
Hemoglobin: 10.9 g/dL — ABNORMAL LOW (ref 11.7–15.5)
MCH: 27.3 pg (ref 27.0–33.0)
MCHC: 32.7 g/dL (ref 32.0–36.0)
MCV: 83.5 fL (ref 80.0–100.0)
MPV: 10.5 fL (ref 7.5–12.5)
Platelets: 389 10*3/uL (ref 140–400)
RBC: 3.99 10*6/uL (ref 3.80–5.10)
RDW: 14.3 % (ref 11.0–15.0)
WBC: 11.7 10*3/uL — ABNORMAL HIGH (ref 3.8–10.8)

## 2018-05-04 LAB — COMPREHENSIVE METABOLIC PANEL
AG Ratio: 1.2 (calc) (ref 1.0–2.5)
ALT: 14 U/L (ref 6–29)
AST: 14 U/L (ref 10–35)
Albumin: 4.1 g/dL (ref 3.6–5.1)
BUN: 16 mg/dL (ref 7–25)
CO2: 28 mmol/L (ref 20–32)
Calcium: 9.7 mg/dL (ref 8.6–10.4)
Chloride: 102 mmol/L (ref 98–110)
Creat: 1.05 mg/dL (ref 0.50–1.05)
Globulin: 3.3 g/dL (calc) (ref 1.9–3.7)
Glucose, Bld: 85 mg/dL (ref 65–99)
Potassium: 4.6 mmol/L (ref 3.5–5.3)
Sodium: 138 mmol/L (ref 135–146)
Total Bilirubin: 0.3 mg/dL (ref 0.2–1.2)
Total Protein: 7.4 g/dL (ref 6.1–8.1)

## 2018-05-04 LAB — RETICULOCYTES
ABS Retic: 35910 {cells}/uL (ref 20000–8000)
Retic Ct Pct: 0.9 %

## 2018-05-04 LAB — IRON,TIBC AND FERRITIN PANEL
%SAT: 14 % — ABNORMAL LOW (ref 16–45)
Ferritin: 25 ng/mL (ref 16–232)
Iron: 51 ug/dL (ref 45–160)
TIBC: 366 mcg/dL (calc) (ref 250–450)

## 2018-05-04 LAB — MAGNESIUM: Magnesium: 1.8 mg/dL (ref 1.5–2.5)

## 2018-05-04 LAB — COMPREHENSIVE METABOLIC PANEL WITH GFR: Alkaline phosphatase (APISO): 102 U/L (ref 37–153)

## 2018-05-04 NOTE — Addendum Note (Signed)
Addended by: Silverio Decamp on: 05/04/2018 11:08 AM   Modules accepted: Orders

## 2018-05-21 ENCOUNTER — Other Ambulatory Visit: Payer: Self-pay | Admitting: Hematology

## 2018-05-21 DIAGNOSIS — D649 Anemia, unspecified: Secondary | ICD-10-CM

## 2018-05-21 NOTE — Progress Notes (Signed)
[facility] OFFICE PROGRESS NOTE  Patient Care Team: Silverio Decamp, MD as PCP - General (Family Medicine)  HEME/ONC OVERVIEW: 1. Normocytic anemia  2. 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  She was followed by NP Laverna Peace of hematology for iron deficiency anemia, and last received IV iron in 01/2015 with improvement in Hgb from ~10 to 12. Patient has not been seen in the hematology clinic since 05/2015, and is now referred back to hematology for the evaluation and management of normocytic anemia (Hgb ~11). Iron profile in 04/2018 showed mild iron deficiency. Of note, colonoscopy was normal in 12/2014. -Clinically, patient denies any GI bleeding, but does endorse very sporadic menorrhagia and occasional blood on the toilet paper after urination -Hgb 10.0 today, lower than in 04/2018; iron profile pending -I personally reviewed the patient's peripheral blood smear, which showed relatively normal-sized RBC's with mild hypochromia, consistent with iron deficiency; WBC morphology was normal; plt morphology was normal and there was no platelet clumping -I suspect her normocytic anemia is most likely multifactorial, including iron deficiency and hx of partial nephrectomy -I have ordered Epo level and Coombs study  -Patient could not tolerate oral iron supplement due to severe constipation -We discussed some of the risks, benefits, and alternatives of intravenous iron infusions.  -The patient is symptomatic from anemia and the iron level is low; given her history of inability to tolerate oral iron supplement, she will need IV iron to higher  levels of iron faster for adequate hematopoesis.  -Some of the side-effects to be expected including risks of infusion reactions, phlebitis, headaches, nausea and fatigue.   -The patient is willing to proceed.  -Goal is to keep ferritin level greater than 50. -1st dose to be administered in clinic today, and 2nd dose on 06/09/2018 -If Hgb does not improve with IV iron infusion, we can consider further work-up to rule out other causes, such as plasma cell disorder   Hx of Stage II papillary RCC  -Patient has not had follow-up for her history of [place] since 2016 -I reviewed the NCCN guideline with the patient -Based on the history of Stage II RCC, the guideline recommends yearly CT CAP (or substitute CT abdomen/pelvis for MRI) for up to 5 years -After extensive discussions, the patient expressed preference for CT instead of MRI, and I have ordered the scans tentatively within the next two weeks  Questionable hematuria -Patient reports occasional blood on the toilet paper after micturition but denies any frank hematuria -CT CAP as above -I encouraged the patient to follow up with her PCP and gynecologist for further evaluation, including pelvic exam  Sporadic menorrhagia -Patient reports very infrequent menorrhagia with heavy blood clots for 2-3 days, which she attributes to menopause -I encouraged the patient to discuss her symptoms with her gynecologist   Orders Placed This Encounter  Procedures  . CT ABDOMEN PELVIS W CONTRAST    Standing Status:   Future    Standing Expiration Date:   05/25/2019    Order Specific Question:   If indicated for the ordered procedure, I authorize the administration of contrast media per Radiology protocol    Answer:   Yes  Order Specific Question:   Preferred imaging location?    Answer:   [facility] Specific Question:   Is Oral Contrast requested for this exam?    Answer:   Yes, Per Radiology protocol    Order Specific Question:    Radiology Contrast Protocol - do NOT remove file path    Answer:   \\[email or web address]    Order Specific Question:   Is patient pregnant?    Answer:   Unknown (Please Explain)  . CT CHEST W CONTRAST    Standing Status:   Future    Standing Expiration Date:   05/25/2019    Order Specific Question:   If indicated for the ordered procedure, I authorize the administration of contrast media per Radiology protocol    Answer:   Yes    Order Specific Question:   Preferred imaging location?    Answer:   [facility] Specific Question:   Radiology Contrast Protocol - do NOT remove file path    Answer:   \\[email or web address]    Order Specific Question:   Is patient pregnant?    Answer:   Unknown (Please Explain)  . CBC with Differential (Cancer Center Only)    Standing Status:   Future    Standing Expiration Date:   06/29/2019  . CMP ([facility] only)    Standing Status:   Future    Standing Expiration Date:   06/29/2019  . Iron and TIBC    Standing Status:   Future    Standing Expiration Date:   06/29/2019  . Ferritin    Standing Status:   Future    Standing Expiration Date:   06/29/2019   All questions were answered. The patient knows to call the clinic with any problems, questions or concerns. No barriers to learning was detected.  Return in 2 months for labs and clinic follow-up.   Tish Men, MD 05/25/2018 3:10 PM  CHIEF COMPLAINT: "I am just tired"  INTERVAL HISTORY: Ms. Janowski presents to clinic for evaluation of normocytic anemia and history of Stage II renal cell carcinoma.  Patient was last seen in the hematology clinic in early 05/2015, but was lost to follow-up until today.  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  The fatigue is constant, worse at the  end of her shift, and improves with drinking 5-hour energy drinks.  In addition, she was recently started on weight loss medication by her PCP, and she has experienced for intermittent muscle cramping's in the lower extremities.  She has occasional blood on the toilet paper after micturition, but denies any frank hematuria in the toilet bowl.  She also reports sporadic menstrual cycles that can be heavy, lasting 2 to 3 days, associated with heavy blood clots, but occurs only once every a few months.  She has not yet discussed these symptoms with her PCP.   REVIEW OF SYSTEMS:   Constitutional: ( - ) fevers, ( - )  chills , ( - ) night sweats Eyes: ( - ) blurriness of vision, ( - ) double vision, ( - ) watery eyes Ears, nose, mouth, throat, and face: ( - ) mucositis, ( - ) sore throat Respiratory: ( - ) cough, ( - ) dyspnea, ( - ) wheezes Cardiovascular: ( - ) palpitation, ( - )  chest discomfort, ( - ) lower extremity swelling Gastrointestinal:  ( - ) nausea, ( - ) heartburn, ( - ) change in bowel habits Skin: ( - ) abnormal skin rashes Lymphatics: ( - ) new lymphadenopathy, ( - ) easy bruising Neurological: ( - ) numbness, ( - ) tingling, ( - ) new weaknesses Behavioral/Psych: ( - ) mood change, ( - ) new changes  All other systems were reviewed with the patient and are negative.  I have reviewed the past medical history, past surgical history, social history and family history with the patient and they are unchanged from previous note.  ALLERGIES:  is allergic to codeine; topamax [topiramate]; and hydromorphone hcl.  MEDICATIONS:  Current Outpatient Medications  Medication Sig Dispense Refill  . buPROPion (WELLBUTRIN XL) 300 MG 24 hr tablet TAKE ONE TABLET BY MOUTH ONCE DAILY 90 tablet 3  . ferrous sulfate 325 (65 FE) MG EC tablet Take 1 tablet (325 mg total) by mouth 3 (three) times daily with meals. 90 tablet 11  . lisinopril-hydrochlorothiazide (PRINZIDE,ZESTORETIC) 20-25 MG tablet Take  0.5 tablets by mouth daily. 30 tablet 3  . meloxicam (MOBIC) 15 MG tablet Take 1 tablet (15 mg total) by mouth daily. 90 tablet 0  . metFORMIN (GLUCOPHAGE) 500 MG tablet Take 1 tablet (500 mg total) by mouth 2 (two) times daily with a meal. 180 tablet 3  . Semaglutide,0.25 or 0.5MG /DOS, (OZEMPIC, 0.25 OR 0.5 MG/DOSE,) 2 MG/1.5ML SOPN Inject 0.5 mg into the skin once a week. Inject 0.25 mg subcutaneous weekly for 4 weeks then inject 0.5 mg subcutaneous weekly 4 pen 11   No current facility-administered medications for this visit.    Facility-Administered Medications Ordered in Other Visits  Medication Dose Route Frequency Provider Last Rate Last Dose  . 0.9 %  sodium chloride infusion   Intravenous Once Tish Men, MD        PHYSICAL EXAMINATION: ECOG PERFORMANCE STATUS: 1 - Symptomatic but completely ambulatory  Today's Vitals   05/25/18 1221 05/25/18 1222  BP: 137/77   Pulse: 87   Resp: 18   Temp: 98 F (36.7 C)   TempSrc: Oral   SpO2: 100%   Weight: 227 lb (103 kg)   Height: 5\' 3"  (1.6 m)   PainSc: 0-No pain 0-No pain   Body mass index is 40.21 kg/m.  Filed Weights   05/25/18 1221  Weight: 227 lb (103 kg)    GENERAL: alert, no distress and comfortable, mildly obese  SKIN: skin color, texture, turgor are normal, no rashes or significant lesions EYES: conjunctiva are pink and non-injected, sclera clear OROPHARYNX: no exudate, no erythema; lips, buccal mucosa, and tongue normal  NECK: supple, non-tender LUNGS: clear to auscultation with normal breathing effort HEART: regular rate & rhythm and no murmurs and no lower extremity edema ABDOMEN: soft, non-tender, non-distended, normal bowel sounds Musculoskeletal: no cyanosis of digits and no clubbing  PSYCH: alert & oriented x 3, fluent speech NEURO: no focal motor/sensory deficits  LABORATORY DATA:  I have reviewed the data as listed    Component Value Date/Time   NA 139 05/25/2018 1153   NA 142 06/08/2015 1056   K 3.7  05/25/2018 1153   K 4.1 06/08/2015 1056   CL 102 05/25/2018 1153   CO2 28 05/25/2018 1153   CO2 31 (H) 06/08/2015 1056   GLUCOSE 84 05/25/2018 1153   GLUCOSE 90 06/08/2015 1056   BUN 18 05/25/2018 1153   BUN 15.2 06/08/2015 1056   CREATININE 1.11 (  H) 05/25/2018 1153   CREATININE 1.05 05/03/2018 1039   CREATININE 1.0 06/08/2015 1056   CALCIUM 9.9 05/25/2018 1153   CALCIUM 9.7 06/08/2015 1056   PROT 7.5 05/25/2018 1153   PROT 7.8 06/08/2015 1056   ALBUMIN 4.3 05/25/2018 1153   ALBUMIN 3.8 06/08/2015 1056   AST 11 (L) 05/25/2018 1153   AST 15 06/08/2015 1056   ALT 10 05/25/2018 1153   ALT 17 06/08/2015 1056   ALKPHOS 85 05/25/2018 1153   ALKPHOS 105 06/08/2015 1056   BILITOT 0.3 05/25/2018 1153   BILITOT 0.37 06/08/2015 1056   GFRNONAA 58 (L) 05/25/2018 1153   GFRAA >60 05/25/2018 1153    No results found for: SPEP, UPEP  Lab Results  Component Value Date   WBC 11.7 (H) 05/03/2018   NEUTROABS 6.1 01/07/2016   HGB 10.9 (L) 05/03/2018   HCT 33.3 (L) 05/03/2018   MCV 83.5 05/03/2018   PLT 389 05/03/2018      Chemistry      Component Value Date/Time   NA 139 05/25/2018 1153   NA 142 06/08/2015 1056   K 3.7 05/25/2018 1153   K 4.1 06/08/2015 1056   CL 102 05/25/2018 1153   CO2 28 05/25/2018 1153   CO2 31 (H) 06/08/2015 1056   BUN 18 05/25/2018 1153   BUN 15.2 06/08/2015 1056   CREATININE 1.11 (H) 05/25/2018 1153   CREATININE 1.05 05/03/2018 1039   CREATININE 1.0 06/08/2015 1056      Component Value Date/Time   CALCIUM 9.9 05/25/2018 1153   CALCIUM 9.7 06/08/2015 1056   ALKPHOS 85 05/25/2018 1153   ALKPHOS 105 06/08/2015 1056   AST 11 (L) 05/25/2018 1153   AST 15 06/08/2015 1056   ALT 10 05/25/2018 1153   ALT 17 06/08/2015 1056   BILITOT 0.3 05/25/2018 1153   BILITOT 0.37 06/08/2015 1056     PATHOLOGY: I personally reviewed the patient's peripheral blood smear today.  The red blood cells were of normal size but had mild hypochromia.  There was no  schistocytosis.  The white blood cells were of normal morphology. There were no peripheral circulating blasts. The platelets were of normal size and I verified that there were no platelet clumping.  RADIOGRAPHIC STUDIES: I have personally reviewed the radiological images as listed below and agreed with the findings in the report. Mm 3d Screen Breast Bilateral  Result Date: 04/28/2018 CLINICAL DATA:  Screening. EXAM: DIGITAL SCREENING BILATERAL MAMMOGRAM WITH TOMO AND CAD COMPARISON:  Previous exam(s). ACR Breast Density Category a: The breast tissue is almost entirely fatty. FINDINGS: There are no findings suspicious for malignancy. Images were processed with CAD. IMPRESSION: No mammographic evidence of malignancy. A result letter of this screening mammogram will be mailed directly to the patient. RECOMMENDATION: Screening mammogram in one year. (Code:SM-B-01Y) BI-RADS CATEGORY  1: Negative. Electronically Signed   By: Lajean Manes M.D.   On: 04/28/2018 12:41

## 2018-05-24 ENCOUNTER — Other Ambulatory Visit: Payer: PRIVATE HEALTH INSURANCE

## 2018-05-24 ENCOUNTER — Ambulatory Visit: Payer: PRIVATE HEALTH INSURANCE | Admitting: Hematology

## 2018-05-24 ENCOUNTER — Ambulatory Visit: Payer: PRIVATE HEALTH INSURANCE

## 2018-05-25 ENCOUNTER — Inpatient Hospital Stay: Payer: PRIVATE HEALTH INSURANCE | Attending: Hematology | Admitting: Hematology

## 2018-05-25 ENCOUNTER — Other Ambulatory Visit: Payer: Self-pay

## 2018-05-25 ENCOUNTER — Inpatient Hospital Stay: Payer: PRIVATE HEALTH INSURANCE

## 2018-05-25 ENCOUNTER — Encounter: Payer: Self-pay | Admitting: Hematology

## 2018-05-25 VITALS — BP 137/77 | HR 87 | Temp 98.0°F | Resp 18 | Ht 63.0 in | Wt 227.0 lb

## 2018-05-25 VITALS — BP 121/65

## 2018-05-25 DIAGNOSIS — D649 Anemia, unspecified: Secondary | ICD-10-CM | POA: Diagnosis not present

## 2018-05-25 DIAGNOSIS — Z85528 Personal history of other malignant neoplasm of kidney: Secondary | ICD-10-CM | POA: Diagnosis not present

## 2018-05-25 DIAGNOSIS — R252 Cramp and spasm: Secondary | ICD-10-CM

## 2018-05-25 DIAGNOSIS — N921 Excessive and frequent menstruation with irregular cycle: Secondary | ICD-10-CM | POA: Insufficient documentation

## 2018-05-25 DIAGNOSIS — Z905 Acquired absence of kidney: Secondary | ICD-10-CM

## 2018-05-25 DIAGNOSIS — R319 Hematuria, unspecified: Secondary | ICD-10-CM

## 2018-05-25 LAB — CMP (CANCER CENTER ONLY)
ALT: 10 U/L (ref 0–44)
AST: 11 U/L — AB (ref 15–41)
Albumin: 4.3 g/dL (ref 3.5–5.0)
Alkaline Phosphatase: 85 U/L (ref 38–126)
Anion gap: 9 (ref 5–15)
BUN: 18 mg/dL (ref 6–20)
CO2: 28 mmol/L (ref 22–32)
Calcium: 9.9 mg/dL (ref 8.9–10.3)
Chloride: 102 mmol/L (ref 98–111)
Creatinine: 1.11 mg/dL — ABNORMAL HIGH (ref 0.44–1.00)
GFR, Est AFR Am: >60 mL/min (ref >60)
GFR, Estimated: 58 mL/min — ABNORMAL LOW (ref >60)
Glucose, Bld: 84 mg/dL (ref 70–99)
Potassium: 3.7 mmol/L (ref 3.5–5.1)
Sodium: 139 mmol/L (ref 135–145)
Total Bilirubin: 0.3 mg/dL (ref 0.3–1.2)
Total Protein: 7.5 g/dL (ref 6.5–8.1)

## 2018-05-25 LAB — CBC WITH DIFFERENTIAL (CANCER CENTER ONLY)
Abs Immature Granulocytes: 0.04 10*3/uL (ref 0.00–0.07)
Basophils Absolute: 0.1 10*3/uL (ref 0.0–0.1)
Basophils Relative: 1 %
Eosinophils Absolute: 0.4 10*3/uL (ref 0.0–0.5)
Eosinophils Relative: 4 %
HEMATOCRIT: 31.9 % — AB (ref 36.0–46.0)
Hemoglobin: 10 g/dL — ABNORMAL LOW (ref 12.0–15.0)
Immature Granulocytes: 0 %
Lymphocytes Relative: 28 %
Lymphs Abs: 2.7 10*3/uL (ref 0.7–4.0)
MCH: 27.9 pg (ref 26.0–34.0)
MCHC: 31.3 g/dL (ref 30.0–36.0)
MCV: 89.1 fL (ref 80.0–100.0)
Monocytes Absolute: 0.5 10*3/uL (ref 0.1–1.0)
Monocytes Relative: 5 %
NEUTROS PCT: 62 %
Neutro Abs: 6.1 10*3/uL (ref 1.7–7.7)
Platelet Count: 343 10*3/uL (ref 150–400)
RBC: 3.58 MIL/uL — ABNORMAL LOW (ref 3.87–5.11)
RDW: 15.5 % (ref 11.5–15.5)
WBC Count: 9.8 10*3/uL (ref 4.0–10.5)
nRBC: 0 % (ref 0.0–0.2)

## 2018-05-25 LAB — DIRECT ANTIGLOBULIN TEST (NOT AT ARMC)
DAT, IgG: NEGATIVE
DAT, complement: NEGATIVE

## 2018-05-25 LAB — LACTATE DEHYDROGENASE: LDH: 147 U/L (ref 98–192)

## 2018-05-25 MED ORDER — SODIUM CHLORIDE 0.9 % IV SOLN
Freq: Once | INTRAVENOUS | Status: AC
  Administered 2018-05-25: 13:00:00 via INTRAVENOUS
  Filled 2018-05-25: qty 250

## 2018-05-25 MED ORDER — SODIUM CHLORIDE 0.9 % IV SOLN
Freq: Once | INTRAVENOUS | Status: DC
  Filled 2018-05-25: qty 250

## 2018-05-25 MED ORDER — SODIUM CHLORIDE 0.9 % IV SOLN
510.0000 mg | Freq: Once | INTRAVENOUS | Status: AC
  Administered 2018-05-25: 510 mg via INTRAVENOUS
  Filled 2018-05-25: qty 17

## 2018-05-26 LAB — IRON AND TIBC
IRON: 61 ug/dL (ref 41–142)
Saturation Ratios: 19 % — ABNORMAL LOW (ref 21–57)
TIBC: 325 ug/dL (ref 236–444)
UIBC: 264 ug/dL (ref 120–384)

## 2018-05-26 LAB — FERRITIN: Ferritin: 32 ng/mL (ref 11–307)

## 2018-05-26 LAB — ERYTHROPOIETIN: Erythropoietin: 22.2 m[IU]/mL — ABNORMAL HIGH (ref 2.6–18.5)

## 2018-06-09 ENCOUNTER — Other Ambulatory Visit: Payer: Self-pay

## 2018-06-09 ENCOUNTER — Inpatient Hospital Stay: Payer: PRIVATE HEALTH INSURANCE

## 2018-06-09 VITALS — BP 108/63 | HR 88 | Temp 97.7°F | Resp 18

## 2018-06-09 DIAGNOSIS — D649 Anemia, unspecified: Secondary | ICD-10-CM

## 2018-06-09 MED ORDER — SODIUM CHLORIDE 0.9 % IV SOLN
510.0000 mg | Freq: Once | INTRAVENOUS | Status: AC
  Administered 2018-06-09: 510 mg via INTRAVENOUS
  Filled 2018-06-09: qty 17

## 2018-06-09 MED ORDER — SODIUM CHLORIDE 0.9 % IV SOLN
Freq: Once | INTRAVENOUS | Status: AC
  Administered 2018-06-09: 09:00:00 via INTRAVENOUS
  Filled 2018-06-09: qty 250

## 2018-06-09 NOTE — Patient Instructions (Signed)

## 2018-06-15 ENCOUNTER — Other Ambulatory Visit: Payer: Self-pay | Admitting: Family

## 2018-06-16 ENCOUNTER — Other Ambulatory Visit: Payer: Self-pay

## 2018-06-16 ENCOUNTER — Ambulatory Visit (HOSPITAL_BASED_OUTPATIENT_CLINIC_OR_DEPARTMENT_OTHER)
Admission: RE | Admit: 2018-06-16 | Discharge: 2018-06-16 | Disposition: A | Discharge status: Home / Self Care | Payer: PRIVATE HEALTH INSURANCE | Source: Ambulatory Visit | Attending: Hematology | Admitting: Hematology

## 2018-06-16 ENCOUNTER — Encounter (HOSPITAL_BASED_OUTPATIENT_CLINIC_OR_DEPARTMENT_OTHER): Payer: Self-pay

## 2018-06-16 DIAGNOSIS — Z85528 Personal history of other malignant neoplasm of kidney: Secondary | ICD-10-CM

## 2018-06-16 MED ORDER — IOHEXOL 300 MG/ML  SOLN
100.0000 mL | Freq: Once | INTRAMUSCULAR | Status: AC | PRN
  Administered 2018-06-16: 100 mL via INTRAVENOUS

## 2018-07-24 ENCOUNTER — Other Ambulatory Visit: Payer: Self-pay | Admitting: Sports Medicine

## 2018-07-24 DIAGNOSIS — M5136 Other intervertebral disc degeneration, lumbar region: Secondary | ICD-10-CM

## 2018-08-02 ENCOUNTER — Encounter: Payer: Self-pay | Admitting: Sports Medicine

## 2018-08-02 ENCOUNTER — Ambulatory Visit (INDEPENDENT_AMBULATORY_CARE_PROVIDER_SITE_OTHER): Payer: PRIVATE HEALTH INSURANCE | Admitting: Sports Medicine

## 2018-08-02 DIAGNOSIS — D649 Anemia, unspecified: Secondary | ICD-10-CM | POA: Diagnosis not present

## 2018-08-02 DIAGNOSIS — M51369 Other intervertebral disc degeneration, lumbar region without mention of lumbar back pain or lower extremity pain: Secondary | ICD-10-CM

## 2018-08-02 DIAGNOSIS — E6609 Other obesity due to excess calories: Secondary | ICD-10-CM | POA: Diagnosis not present

## 2018-08-02 DIAGNOSIS — M5136 Other intervertebral disc degeneration, lumbar region: Secondary | ICD-10-CM

## 2018-08-02 MED ORDER — CELECOXIB 200 MG PO CAPS: ORAL_CAPSULE | ORAL | 2 refills | Status: DC

## 2018-08-02 MED ORDER — PSYLLIUM 58.6 % PO POWD: 1.0000 | Freq: Three times a day (TID) | ORAL | 12 refills | Status: DC

## 2018-08-02 NOTE — Assessment & Plan Note (Signed)
Switching from meloxicam to Celebrex.

## 2018-08-02 NOTE — Progress Notes (Signed)
Subjective:    CC: Follow-up  HPI: Obesity: Good weight loss on Ozempic.  Iron deficiency anemia: Has had a couple of iron infusions, doing well, she is on meloxicam.  Agreeable to discontinue this and switch to something else.  Constipation: Likely secondary to the is a bit, feels bloated, she stools twice per day but feels that the volume is insufficient.  No bleeding.  I reviewed the past medical history, family history, social history, surgical history, and allergies today and no changes were needed.  Please see the problem list section below in epic for further details.  Past Medical History: Past Medical History:  Diagnosis Date  . Abnormal Pap smear of cervix   . Anemia    history of iron deficiency-last 01-26-15. History of" chronic elevated white cell count"  . Hematuria    "blood in urine"  . Hypertension   . Pain in hip joint    left hip pain intermittent  . PPD positive, treated   . Renal cell carcinoma ([place])   . Wound, open    right index finger "injury" -remains -has wrapped with bandage.   Past Surgical History: Past Surgical History:  Procedure Laterality Date  . CARPAL TUNNEL RELEASE Bilateral   . CERVICAL CONIZATION W/BX     "cervical cancer"  . CHOLECYSTECTOMY     laparoscopic  . FOOT SURGERY Right    "neuroma"  . HEEL SPUR SURGERY    . NASAL SINUS SURGERY    . ROBOTIC ASSITED PARTIAL NEPHRECTOMY Left 02/15/2015   Procedure: ROBOTIC ASSITED PARTIAL NEPHRECTOMY;  Surgeon: Raynelle Bring, MD;  Location: [facility];  Service: Urology;  Laterality: Left;   Social History: Social History   Socioeconomic History  . Marital status: Married    Spouse name: Not on file  . Number of children: Not on file  . Years of education: Not on file  . Highest education level: Not on file  Occupational History  . Occupation: CNA  Social Needs  . Financial resource strain: Not on file  . Food insecurity:    Worry: Not on file    Inability: Not on file  .  Transportation needs:    Medical: Not on file    Non-medical: Not on file  Tobacco Use  . Smoking status: Never Smoker  . Smokeless tobacco: Never Used  Substance and Sexual Activity  . Alcohol use: No    Alcohol/week: 0.0 standard drinks  . Drug use: No  . Sexual activity: Yes    Partners: Male    Comment: vasectomy  Lifestyle  . Physical activity:    Days per week: Not on file    Minutes per session: Not on file  . Stress: Not on file  Relationships  . Social connections:    Talks on phone: Not on file    Gets together: Not on file    Attends religious service: Not on file    Active member of club or organization: Not on file    Attends meetings of clubs or organizations: Not on file    Relationship status: Not on file  Other Topics Concern  . Not on file  Social History Narrative  . Not on file   Family History: Family History  Problem Relation Age of Onset  . Heart disease Mother   . Heart disease Father    Allergies: Allergies  Allergen Reactions  . Codeine Nausea And Vomiting  . Topamax [Topiramate] Other (See Comments)    Intolerant even after 2  weeks  . Hydromorphone Hcl Nausea And Vomiting   Medications: See med rec.  Review of Systems: No fevers, chills, night sweats, weight loss, chest pain, or shortness of breath.   Objective:    General: Well Developed, well nourished, and in no acute distress.  Neuro: Alert and oriented x3, extra-ocular muscles intact, sensation grossly intact.  HEENT: Normocephalic, atraumatic, pupils equal round reactive to light, neck supple, no masses, no lymphadenopathy, thyroid nonpalpable.  Skin: Warm and dry, no rashes. Cardiac: Regular rate and rhythm, no murmurs rubs or gallops, no lower extremity edema.  Respiratory: Clear to auscultation bilaterally. Not using accessory muscles, speaking in full sentences.  Impression and Recommendations:    Obesity Fantastic weight loss on the first 3 months of Ozempic. She is  doing the full 1 mg dose, metformin as well. Continue, no changes here. She does feel a bit bloated, and constipated, this is a normal adverse effect from Ozempic. I would like her to add Metamucil 3 times daily, she stools twice per day, but feels as though there is not enough bulk.  Normocytic anemia Iron deficiency anemia. Not doing any oral iron, she did a few infusions and feels significantly better. She does have a follow-up coming up with hematology. I would like her to discontinue her meloxicam altogether, we can try Celebrex for her pain, this will be less likely to cause a gastritis.  Lumbar degenerative disc disease Switching from meloxicam to Celebrex.   ___________________________________________ Gwen Her. Dianah Field, M.D., ABFM., CAQSM. Primary Care and Sports Medicine [facility]  Adjunct Professor of [employer]

## 2018-08-02 NOTE — Assessment & Plan Note (Signed)
Fantastic weight loss on the first 3 months of Ozempic. She is doing the full 1 mg dose, metformin as well. Continue, no changes here. She does feel a bit bloated, and constipated, this is a normal adverse effect from Ozempic. I would like her to add Metamucil 3 times daily, she stools twice per day, but feels as though there is not enough bulk.

## 2018-08-02 NOTE — Assessment & Plan Note (Signed)
Iron deficiency anemia. Not doing any oral iron, she did a few infusions and feels significantly better. She does have a follow-up coming up with hematology. I would like her to discontinue her meloxicam altogether, we can try Celebrex for her pain, this will be less likely to cause a gastritis.

## 2018-08-04 NOTE — Progress Notes (Signed)
[facility] OFFICE PROGRESS NOTE  Patient Care Team: Silverio Decamp, MD as PCP - General (Sports Medicine)  HEME/ONC OVERVIEW: 1. 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 Multifactorial normocytic anemia, including iron deficiency and hx of partial nephrectomy  TREATMENT REGIMEN:  PRN IV iron, last in 05/2018  ASSESSMENT & PLAN:   Hx of Stage II papillary RCC  -Lost to follow-up from 2016 to early 2020 -I independently reviewed the radiologic images of recent CT CAP, and agree with findings as documented -In summary, CT CAP in 06/2018 showed no evidence of recurrent or metastatic disease -I reviewed NCCN guidelines in detail with the patient -Given the history of stage II RCC, she requires yearly CT CAP follow-up to 5 years for disease surveillance (next and final due in 06/2019) -If NED after 5 years, the risk of disease recurrence becomes very low, and no further imaging surveillance is recommended  Normocytic anemia -Most likely multifactorial, including iron deficiency and history of partial nephrectomy -Epo level elevated, [place] study negative -Patient received IV iron x 2 in 05/2018  -Hgb 10.9 today, slightly improving -Iron profile pending -Colonoscopy normal in 12/2014 -Continue daily multivitamin with iron  -If creatinine remains elevated or patient develops worsening anemia, we can consider checking MM labs to rule out plasma cell dyscrasia   Elevated Cr -Cr 1.23 today, up from baseline of 0.9-1.1; electrolytes normal -Patient was started on HCTZ-lisinopril in 03/2018, which is likely causing his Cr to be rising -I encouraged patient to contact her PCP for management of his anti-hypertensives and monitoring of renal function  Orders Placed This Encounter  Procedures  . CBC with Differential (Cancer Center Only)    Standing Status:   Future    Standing  Expiration Date:   09/16/2019  . CMP ([facility] only)    Standing Status:   Future    Standing Expiration Date:   09/16/2019  . Ferritin    Standing Status:   Future    Standing Expiration Date:   09/16/2019  . Iron and TIBC    Standing Status:   Future    Standing Expiration Date:   09/16/2019    All questions were answered. The patient knows to call the clinic with any problems, questions or concerns. No barriers to learning was detected.  Return in 6 months for labs and clinic follow-up.  Tish Men, MD 08/12/2018 12:08 PM  CHIEF COMPLAINT: "I am feeling better"  INTERVAL HISTORY: Mr. [name] returns to clinic for follow-up of history of renal cell carcinoma and normocytic anemia.  Patient reports that she tolerated IV iron well without significant side effects, and since completing IV iron, her craving for ice has been much less, and her energy level is improving.  She was started on lisinopril-HCTZ by PCP 01/2019.  She does not check her blood pressure regularly at home, but feels that her blood pressure has gotten better and therefore does not think that she requires as high dose anti-hypertensives.  He denies any other complaint today.  REVIEW OF SYSTEMS:   Constitutional: ( - ) fevers, ( - )  chills , ( - ) night sweats Eyes: ( - ) blurriness of vision, ( - ) double vision, ( - ) watery eyes Ears, nose, mouth, throat, and face: ( - ) mucositis, ( - ) sore throat Respiratory: ( - ) cough, ( - ) dyspnea, ( - )  wheezes Cardiovascular: ( - ) palpitation, ( - ) chest discomfort, ( - ) lower extremity swelling Gastrointestinal:  ( - ) nausea, ( - ) heartburn, ( - ) change in bowel habits Skin: ( - ) abnormal skin rashes Lymphatics: ( - ) new lymphadenopathy, ( - ) easy bruising Neurological: ( - ) numbness, ( - ) tingling, ( - ) new weaknesses Behavioral/Psych: ( - ) mood change, ( - ) new changes  All other systems were reviewed with the patient and are negative.  I have reviewed the  past medical history, past surgical history, social history and family history with the patient and they are unchanged from previous note.  ALLERGIES:  is allergic to codeine; topamax [topiramate]; and hydromorphone hcl.  MEDICATIONS:  Current Outpatient Medications  Medication Sig Dispense Refill  . albuterol (VENTOLIN HFA) 108 (90 Base) MCG/ACT inhaler Inhale into the lungs.    Marland Kitchen buPROPion (WELLBUTRIN XL) 300 MG 24 hr tablet TAKE ONE TABLET BY MOUTH ONCE DAILY 90 tablet 3  . celecoxib (CELEBREX) 200 MG capsule One to 2 tablets by mouth daily as needed for pain. 60 capsule 2  . exenatide (BYETTA) 10 MCG/0.04ML SOPN injection Inject into the skin.    Marland Kitchen ibuprofen (ADVIL) 800 MG tablet Take by mouth.    Marland Kitchen lisinopril-hydrochlorothiazide (PRINZIDE,ZESTORETIC) 20-25 MG tablet Take 0.5 tablets by mouth daily. 30 tablet 3  . metFORMIN (GLUCOPHAGE) 500 MG tablet Take 1 tablet (500 mg total) by mouth 2 (two) times daily with a meal. 180 tablet 3  . psyllium (METAMUCIL SMOOTH TEXTURE) 58.6 % powder Take 1 packet by mouth 3 (three) times daily. 283 g 12  . Semaglutide, 1 MG/DOSE, (OZEMPIC, 1 MG/DOSE,) 2 MG/1.5ML SOPN Inject 1 mg into the skin once a week. 4 pen 11   No current facility-administered medications for this visit.     PHYSICAL EXAMINATION: ECOG PERFORMANCE STATUS: 1 - Symptomatic but completely ambulatory  Today's Vitals   08/12/18 1139  Height: 5\' 3"  (1.6 m)  PainSc: 0-No pain   Body mass index is 38.44 kg/m.  There were no vitals filed for this visit.  GENERAL: alert, no distress and comfortable SKIN: skin color, texture, turgor are normal, no rashes or significant lesions EYES: conjunctiva are pink and non-injected, sclera clear OROPHARYNX: no exudate, no erythema; lips, buccal mucosa, and tongue normal  NECK: supple, non-tender LUNGS: clear to auscultation with normal breathing effort HEART: regular rate & rhythm and no murmurs and no lower extremity edema ABDOMEN: soft,  non-tender, non-distended, normal bowel sounds Musculoskeletal: no cyanosis of digits and no clubbing  PSYCH: alert & oriented x 3, fluent speech NEURO: no focal motor/sensory deficits  LABORATORY DATA:  I have reviewed the data as listed    Component Value Date/Time   NA 139 08/12/2018 1105   NA 142 06/08/2015 1056   K 4.6 08/12/2018 1105   K 4.1 06/08/2015 1056   CL 102 08/12/2018 1105   CO2 29 08/12/2018 1105   CO2 31 (H) 06/08/2015 1056   GLUCOSE 93 08/12/2018 1105   GLUCOSE 90 06/08/2015 1056   BUN 19 08/12/2018 1105   BUN 15.2 06/08/2015 1056   CREATININE 1.23 (H) 08/12/2018 1105   CREATININE 1.05 05/03/2018 1039   CREATININE 1.0 06/08/2015 1056   CALCIUM 9.7 08/12/2018 1105   CALCIUM 9.7 06/08/2015 1056   PROT 7.7 08/12/2018 1105   PROT 7.8 06/08/2015 1056   ALBUMIN 4.3 08/12/2018 1105   ALBUMIN 3.8 06/08/2015 1056   AST  10 (L) 08/12/2018 1105   AST 15 06/08/2015 1056   ALT 11 08/12/2018 1105   ALT 17 06/08/2015 1056   ALKPHOS 98 08/12/2018 1105   ALKPHOS 105 06/08/2015 1056   BILITOT 0.4 08/12/2018 1105   BILITOT 0.37 06/08/2015 1056   GFRNONAA 51 (L) 08/12/2018 1105   GFRAA 59 (L) 08/12/2018 1105    No results found for: SPEP, UPEP  Lab Results  Component Value Date   WBC 12.9 (H) 08/12/2018   NEUTROABS 8.8 (H) 08/12/2018   HGB 10.9 (L) 08/12/2018   HCT 34.1 (L) 08/12/2018   MCV 95.8 08/12/2018   PLT 351 08/12/2018      Chemistry      Component Value Date/Time   NA 139 08/12/2018 1105   NA 142 06/08/2015 1056   K 4.6 08/12/2018 1105   K 4.1 06/08/2015 1056   CL 102 08/12/2018 1105   CO2 29 08/12/2018 1105   CO2 31 (H) 06/08/2015 1056   BUN 19 08/12/2018 1105   BUN 15.2 06/08/2015 1056   CREATININE 1.23 (H) 08/12/2018 1105   CREATININE 1.05 05/03/2018 1039   CREATININE 1.0 06/08/2015 1056      Component Value Date/Time   CALCIUM 9.7 08/12/2018 1105   CALCIUM 9.7 06/08/2015 1056   ALKPHOS 98 08/12/2018 1105   ALKPHOS 105 06/08/2015 1056    AST 10 (L) 08/12/2018 1105   AST 15 06/08/2015 1056   ALT 11 08/12/2018 1105   ALT 17 06/08/2015 1056   BILITOT 0.4 08/12/2018 1105   BILITOT 0.37 06/08/2015 1056

## 2018-08-10 ENCOUNTER — Other Ambulatory Visit: Payer: PRIVATE HEALTH INSURANCE

## 2018-08-10 ENCOUNTER — Ambulatory Visit: Payer: PRIVATE HEALTH INSURANCE | Admitting: Hematology

## 2018-08-12 ENCOUNTER — Encounter: Payer: Self-pay | Admitting: Hematology

## 2018-08-12 ENCOUNTER — Inpatient Hospital Stay: Payer: PRIVATE HEALTH INSURANCE | Attending: Hematology

## 2018-08-12 ENCOUNTER — Other Ambulatory Visit: Payer: Self-pay

## 2018-08-12 ENCOUNTER — Inpatient Hospital Stay (HOSPITAL_BASED_OUTPATIENT_CLINIC_OR_DEPARTMENT_OTHER): Payer: PRIVATE HEALTH INSURANCE | Admitting: Hematology

## 2018-08-12 ENCOUNTER — Telehealth: Payer: Self-pay | Admitting: Hematology

## 2018-08-12 VITALS — Ht 63.0 in

## 2018-08-12 DIAGNOSIS — Z905 Acquired absence of kidney: Secondary | ICD-10-CM

## 2018-08-12 DIAGNOSIS — D649 Anemia, unspecified: Secondary | ICD-10-CM | POA: Diagnosis present

## 2018-08-12 DIAGNOSIS — Z85528 Personal history of other malignant neoplasm of kidney: Secondary | ICD-10-CM | POA: Diagnosis not present

## 2018-08-12 DIAGNOSIS — Z79899 Other long term (current) drug therapy: Secondary | ICD-10-CM | POA: Diagnosis not present

## 2018-08-12 DIAGNOSIS — R7989 Other specified abnormal findings of blood chemistry: Secondary | ICD-10-CM

## 2018-08-12 LAB — CBC WITH DIFFERENTIAL (CANCER CENTER ONLY)
Abs Immature Granulocytes: 0.11 10*3/uL — ABNORMAL HIGH (ref 0.00–0.07)
Basophils Absolute: 0.1 10*3/uL (ref 0.0–0.1)
Basophils Relative: 1 %
Eosinophils Absolute: 0.3 10*3/uL (ref 0.0–0.5)
Eosinophils Relative: 2 %
HCT: 34.1 % — ABNORMAL LOW (ref 36.0–46.0)
Hemoglobin: 10.9 g/dL — ABNORMAL LOW (ref 12.0–15.0)
Immature Granulocytes: 1 %
Lymphocytes Relative: 23 %
Lymphs Abs: 3 10*3/uL (ref 0.7–4.0)
MCH: 30.6 pg (ref 26.0–34.0)
MCHC: 32 g/dL (ref 30.0–36.0)
MCV: 95.8 fL (ref 80.0–100.0)
Monocytes Absolute: 0.7 10*3/uL (ref 0.1–1.0)
Monocytes Relative: 5 %
Neutro Abs: 8.8 10*3/uL — ABNORMAL HIGH (ref 1.7–7.7)
Neutrophils Relative %: 68 %
Platelet Count: 351 10*3/uL (ref 150–400)
RBC: 3.56 MIL/uL — ABNORMAL LOW (ref 3.87–5.11)
RDW: 14.3 % (ref 11.5–15.5)
WBC Count: 12.9 10*3/uL — ABNORMAL HIGH (ref 4.0–10.5)
nRBC: 0 % (ref 0.0–0.2)

## 2018-08-12 LAB — CMP (CANCER CENTER ONLY)
ALT: 11 U/L (ref 0–44)
AST: 10 U/L — ABNORMAL LOW (ref 15–41)
Albumin: 4.3 g/dL (ref 3.5–5.0)
Alkaline Phosphatase: 98 U/L (ref 38–126)
Anion gap: 8 (ref 5–15)
BUN: 19 mg/dL (ref 6–20)
CO2: 29 mmol/L (ref 22–32)
Calcium: 9.7 mg/dL (ref 8.9–10.3)
Chloride: 102 mmol/L (ref 98–111)
Creatinine: 1.23 mg/dL — ABNORMAL HIGH (ref 0.44–1.00)
GFR, Est AFR Am: 59 mL/min — ABNORMAL LOW (ref >60)
GFR, Estimated: 51 mL/min — ABNORMAL LOW (ref >60)
Glucose, Bld: 93 mg/dL (ref 70–99)
Potassium: 4.6 mmol/L (ref 3.5–5.1)
Sodium: 139 mmol/L (ref 135–145)
Total Bilirubin: 0.4 mg/dL (ref 0.3–1.2)
Total Protein: 7.7 g/dL (ref 6.5–8.1)

## 2018-08-12 NOTE — Telephone Encounter (Signed)
Appts scheduled per 5/28 los

## 2018-08-13 LAB — IRON AND TIBC
Iron: 56 ug/dL (ref 41–142)
Saturation Ratios: 22 % (ref 21–57)
TIBC: 253 ug/dL (ref 236–444)
UIBC: 197 ug/dL (ref 120–384)

## 2018-08-13 LAB — FERRITIN: Ferritin: 270 ng/mL (ref 11–307)

## 2018-10-02 ENCOUNTER — Other Ambulatory Visit: Payer: Self-pay | Admitting: Sports Medicine

## 2018-10-21 ENCOUNTER — Ambulatory Visit: Payer: PRIVATE HEALTH INSURANCE | Admitting: Sports Medicine

## 2018-11-07 ENCOUNTER — Emergency Department
Admission: EM | Admit: 2018-11-07 | Discharge: 2018-11-07 | Disposition: A | Discharge status: Home / Self Care | Payer: PRIVATE HEALTH INSURANCE | Source: Home / Self Care

## 2018-11-07 ENCOUNTER — Other Ambulatory Visit: Payer: Self-pay

## 2018-11-07 ENCOUNTER — Encounter: Payer: Self-pay | Admitting: Emergency Medicine

## 2018-11-07 DIAGNOSIS — M25511 Pain in right shoulder: Secondary | ICD-10-CM

## 2018-11-07 DIAGNOSIS — M545 Low back pain, unspecified: Secondary | ICD-10-CM

## 2018-11-07 MED ORDER — CYCLOBENZAPRINE HCL 5 MG PO TABS: 5.0000 mg | ORAL_TABLET | Freq: Two times a day (BID) | ORAL | 0 refills | Status: DC | PRN
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
## 2018-11-07 NOTE — Discharge Instructions (Signed)
°  Flexeril (cyclobenzaprine) is a muscle relaxer and may cause drowsiness. Do not drink alcohol, drive, or operate heavy machinery while taking.  You may take 500mg  acetaminophen every 4-6 hours as needed for pain. You may also alternate cool and warm compresses.  Please follow up with Dr. Dianah Field later this week if not improving. It may take a few days for soreness to resolve.
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
## 2019-02-02 ENCOUNTER — Ambulatory Visit: Payer: PRIVATE HEALTH INSURANCE | Admitting: Sports Medicine

## 2019-02-03 ENCOUNTER — Ambulatory Visit (INDEPENDENT_AMBULATORY_CARE_PROVIDER_SITE_OTHER): Payer: PRIVATE HEALTH INSURANCE | Admitting: Sports Medicine

## 2019-02-03 ENCOUNTER — Other Ambulatory Visit: Payer: Self-pay

## 2019-02-03 ENCOUNTER — Encounter: Payer: Self-pay | Admitting: Sports Medicine

## 2019-02-03 VITALS — BP 134/85 | HR 93 | Ht 63.0 in | Wt 222.0 lb

## 2019-02-03 DIAGNOSIS — R5383 Other fatigue: Secondary | ICD-10-CM

## 2019-02-03 DIAGNOSIS — E039 Hypothyroidism, unspecified: Secondary | ICD-10-CM

## 2019-02-03 DIAGNOSIS — G4719 Other hypersomnia: Secondary | ICD-10-CM | POA: Diagnosis not present

## 2019-02-03 DIAGNOSIS — D649 Anemia, unspecified: Secondary | ICD-10-CM

## 2019-02-03 DIAGNOSIS — E559 Vitamin D deficiency, unspecified: Secondary | ICD-10-CM

## 2019-02-03 DIAGNOSIS — I10 Essential (primary) hypertension: Secondary | ICD-10-CM | POA: Diagnosis not present

## 2019-02-03 DIAGNOSIS — E6609 Other obesity due to excess calories: Secondary | ICD-10-CM

## 2019-02-03 DIAGNOSIS — R7989 Other specified abnormal findings of blood chemistry: Secondary | ICD-10-CM

## 2019-02-03 DIAGNOSIS — M170 Bilateral primary osteoarthritis of knee: Secondary | ICD-10-CM

## 2019-02-03 DIAGNOSIS — E785 Hyperlipidemia, unspecified: Secondary | ICD-10-CM

## 2019-02-03 DIAGNOSIS — E038 Other specified hypothyroidism: Secondary | ICD-10-CM

## 2019-02-03 MED ORDER — ACETAMINOPHEN ER 650 MG PO TBCR: 650.0000 mg | EXTENDED_RELEASE_TABLET | Freq: Three times a day (TID) | ORAL | 3 refills | Status: DC | PRN

## 2019-02-03 MED ORDER — VALSARTAN-HYDROCHLOROTHIAZIDE 160-25 MG PO TABS: 1.0000 | ORAL_TABLET | Freq: Every day | ORAL | 3 refills | Status: DC

## 2019-02-03 NOTE — Assessment & Plan Note (Signed)
Persistent discomfort, she did well after injections in 2018. For now we will discontinue all NSAIDs due to renal dysfunction, switch to arthritis from Tylenol, we can proceed with additional injections in the future if needed.

## 2019-02-03 NOTE — Assessment & Plan Note (Addendum)
Under improved control. Unfortunately developed a cough with lisinopril, switching to valsartan/HCTZ.

## 2019-02-03 NOTE — Assessment & Plan Note (Signed)
Never actually did her sleep study, ordering this again.

## 2019-02-03 NOTE — Assessment & Plan Note (Addendum)
She understands the fatigue is multifactorial. Adding sleep study, routine labs, she does endorse a viral illness several months ago, she was extremely fatigued, I am going to add Covid antibodies as well. She also has a diagnosis of narcolepsy with neurology, if everything else comes back negative we may consider treatment with amphetamines.  Vitamin D is low, calling in supplement.

## 2019-02-03 NOTE — Assessment & Plan Note (Signed)
Holding off on treatment with Ozempic for now, she is not a diabetic so we are going to also stop Metformin.

## 2019-02-03 NOTE — Progress Notes (Addendum)
Subjective:    CC: Multiple issues  HPI: Mc returns, she is a pleasant 53 year old female with chronic fatigue, we had initially ordered a sleep study, she has not had this done, see below for further details.  Obesity: Historically has been on Ozempic, stopped it, gained some weight back.  Hypertension: Developed a cough on lisinopril/HCTZ.  I reviewed the past medical history, family history, social history, surgical history, and allergies today and no changes were needed.  Please see the problem list section below in epic for further details.  Past Medical History: Past Medical History:  Diagnosis Date  . Abnormal Pap smear of cervix   . Anemia    history of iron deficiency-last 01-26-15. History of" chronic elevated white cell count"  . Hematuria    "blood in urine"  . Hypertension   . Pain in hip joint    left hip pain intermittent  . PPD positive, treated   . Renal cell carcinoma ([place])   . Wound, open    right index finger "injury" -remains -has wrapped with bandage.   Past Surgical History: Past Surgical History:  Procedure Laterality Date  . CARPAL TUNNEL RELEASE Bilateral   . CERVICAL CONIZATION W/BX     "cervical cancer"  . CHOLECYSTECTOMY     laparoscopic  . FOOT SURGERY Right    "neuroma"  . HEEL SPUR SURGERY    . NASAL SINUS SURGERY    . ROBOTIC ASSITED PARTIAL NEPHRECTOMY Left 02/15/2015   Procedure: ROBOTIC ASSITED PARTIAL NEPHRECTOMY;  Surgeon: Raynelle Bring, MD;  Location: [facility];  Service: Urology;  Laterality: Left;   Social History: Social History   Socioeconomic History  . Marital status: Married    Spouse name: Not on file  . Number of children: Not on file  . Years of education: Not on file  . Highest education level: Not on file  Occupational History  . Occupation: CNA  Social Needs  . Financial resource strain: Not on file  . Food insecurity    Worry: Not on file    Inability: Not on file  . Transportation needs    Medical:  Not on file    Non-medical: Not on file  Tobacco Use  . Smoking status: Never Smoker  . Smokeless tobacco: Never Used  Substance and Sexual Activity  . Alcohol use: No    Alcohol/week: 0.0 standard drinks  . Drug use: No  . Sexual activity: Yes    Partners: Male    Comment: vasectomy  Lifestyle  . Physical activity    Days per week: Not on file    Minutes per session: Not on file  . Stress: Not on file  Relationships  . Social Herbalist on phone: Not on file    Gets together: Not on file    Attends religious service: Not on file    Active member of club or organization: Not on file    Attends meetings of clubs or organizations: Not on file    Relationship status: Not on file  Other Topics Concern  . Not on file  Social History Narrative  . Not on file   Family History: Family History  Problem Relation Age of Onset  . Heart disease Mother   . Heart disease Father    Allergies: Allergies  Allergen Reactions  . Codeine Nausea And Vomiting  . Topamax [Topiramate] Other (See Comments)    Intolerant even after 2 weeks  . Ace Inhibitors Cough  . Hydromorphone  Hcl Nausea And Vomiting   Medications: See med rec.  Review of Systems: No fevers, chills, night sweats, weight loss, chest pain, or shortness of breath.   Objective:    General: Well Developed, well nourished, and in no acute distress.  Neuro: Alert and oriented x3, extra-ocular muscles intact, sensation grossly intact.  HEENT: Normocephalic, atraumatic, pupils equal round reactive to light, neck supple, no masses, no lymphadenopathy, thyroid nonpalpable.  Skin: Warm and dry, no rashes. Cardiac: Regular rate and rhythm, no murmurs rubs or gallops, no lower extremity edema.  Respiratory: Clear to auscultation bilaterally. Not using accessory muscles, speaking in full sentences.  Impression and Recommendations:    Excessive daytime sleepiness Never actually did her sleep study, ordering this again.   Benign essential hypertension Under improved control. Unfortunately developed a cough with lisinopril, switching to valsartan/HCTZ.  Fatigue She understands the fatigue is multifactorial. Adding sleep study, routine labs, she does endorse a viral illness several months ago, she was extremely fatigued, I am going to add Covid antibodies as well. She also has a diagnosis of narcolepsy with neurology, if everything else comes back negative we may consider treatment with amphetamines.  Vitamin D is low, calling in supplement.   Obesity Holding off on treatment with Ozempic for now, she is not a diabetic so we are going to also stop Metformin.  Primary osteoarthritis of both knees Persistent discomfort, she did well after injections in 2018. For now we will discontinue all NSAIDs due to renal dysfunction, switch to arthritis from Tylenol, we can proceed with additional injections in the future if needed.  Subclinical hypothyroidism TSH is slightly high, please add T3 and T4 levels.  This does not yet indicate hypothyroidism unless T3 and T4 levels are low.  T3 and T4 levels are normal, no further treatment needed, we can watch this for now.  Normocytic anemia Has iron deficiency without anemia, she will supplement.  Hyperlipidemia Lipids are elevated but we will put this on the back burner for now  I spent 40 minutes with this patient, greater than 50% was face-to-face time counseling regarding the above diagnoses  ___________________________________________ Gwen Her. Dianah Field, M.D., ABFM., CAQSM. Primary Care and Sports Medicine [facility]  Adjunct Professor of [place] [employer] [facility] of Medicine

## 2019-02-04 DIAGNOSIS — E039 Hypothyroidism, unspecified: Secondary | ICD-10-CM | POA: Insufficient documentation

## 2019-02-04 DIAGNOSIS — E038 Other specified hypothyroidism: Secondary | ICD-10-CM | POA: Insufficient documentation

## 2019-02-04 DIAGNOSIS — E785 Hyperlipidemia, unspecified: Secondary | ICD-10-CM | POA: Insufficient documentation

## 2019-02-04 MED ORDER — VITAMIN D (ERGOCALCIFEROL) 1.25 MG (50000 UNIT) PO CAPS: 50000.0000 [IU] | ORAL_CAPSULE | ORAL | 0 refills | Status: DC

## 2019-02-04 NOTE — Assessment & Plan Note (Addendum)
TSH is slightly high, please add T3 and T4 levels.  This does not yet indicate hypothyroidism unless T3 and T4 levels are low.  T3 and T4 levels are normal, no further treatment needed, we can watch this for now.

## 2019-02-04 NOTE — Assessment & Plan Note (Signed)
Has iron deficiency without anemia, she will supplement.

## 2019-02-04 NOTE — Assessment & Plan Note (Signed)
Lipids are elevated but we will put this on the back burner for now

## 2019-02-04 NOTE — Addendum Note (Signed)
Addended by: Silverio Decamp on: 02/04/2019 08:47 AM   Modules accepted: Orders

## 2019-02-05 LAB — COMPLETE METABOLIC PANEL WITH GFR
AG Ratio: 1.4 (calc) (ref 1.0–2.5)
ALT: 19 U/L (ref 6–29)
AST: 18 U/L (ref 10–35)
Albumin: 4.1 g/dL (ref 3.6–5.1)
Alkaline phosphatase (APISO): 105 U/L (ref 37–153)
BUN/Creatinine Ratio: 18 (calc) (ref 6–22)
BUN: 23 mg/dL (ref 7–25)
CO2: 25 mmol/L (ref 20–32)
Calcium: 9.4 mg/dL (ref 8.6–10.4)
Chloride: 103 mmol/L (ref 98–110)
Creat: 1.25 mg/dL — ABNORMAL HIGH (ref 0.50–1.05)
GFR, Est African American: 58 mL/min/{1.73_m2} — ABNORMAL LOW (ref >=60)
GFR, Est Non African American: 50 mL/min/{1.73_m2} — ABNORMAL LOW (ref >=60)
Globulin: 3 g/dL (calc) (ref 1.9–3.7)
Glucose, Bld: 82 mg/dL (ref 65–99)
Potassium: 4.4 mmol/L (ref 3.5–5.3)
Sodium: 137 mmol/L (ref 135–146)
Total Bilirubin: 0.2 mg/dL (ref 0.2–1.2)
Total Protein: 7.1 g/dL (ref 6.1–8.1)

## 2019-02-05 LAB — B12 AND FOLATE PANEL
Folate: 9 ng/mL
Vitamin B-12: 632 pg/mL (ref 200–1100)

## 2019-02-05 LAB — CBC
HCT: 34.1 % — ABNORMAL LOW (ref 35.0–45.0)
Hemoglobin: 11.1 g/dL — ABNORMAL LOW (ref 11.7–15.5)
MCH: 30.5 pg (ref 27.0–33.0)
MCHC: 32.6 g/dL (ref 32.0–36.0)
MCV: 93.7 fL (ref 80.0–100.0)
MPV: 10.8 fL (ref 7.5–12.5)
Platelets: 339 10*3/uL (ref 140–400)
RBC: 3.64 10*6/uL — ABNORMAL LOW (ref 3.80–5.10)
RDW: 12.6 % (ref 11.0–15.0)
WBC: 12.6 10*3/uL — ABNORMAL HIGH (ref 3.8–10.8)

## 2019-02-05 LAB — TSH: TSH: 4.75 mIU/L — ABNORMAL HIGH

## 2019-02-05 LAB — LIPID PANEL W/REFLEX DIRECT LDL
Cholesterol: 235 mg/dL — ABNORMAL HIGH (ref <200)
HDL: 56 mg/dL (ref >=50)
LDL Cholesterol (Calc): 141 mg/dL (calc) — ABNORMAL HIGH
Non-HDL Cholesterol (Calc): 179 mg/dL (calc) — ABNORMAL HIGH (ref <130)
Total CHOL/HDL Ratio: 4.2 (calc) (ref <5.0)
Triglycerides: 238 mg/dL — ABNORMAL HIGH (ref <150)

## 2019-02-05 LAB — IRON,TIBC AND FERRITIN PANEL
%SAT: 11 % (calc) — ABNORMAL LOW (ref 16–45)
Ferritin: 98 ng/mL (ref 16–232)
Iron: 34 ug/dL — ABNORMAL LOW (ref 45–160)
TIBC: 298 mcg/dL (calc) (ref 250–450)

## 2019-02-05 LAB — TEST AUTHORIZATION

## 2019-02-05 LAB — RETICULOCYTES
ABS Retic: 72800 cells/uL (ref 20000–8000)
Retic Ct Pct: 2 %

## 2019-02-05 LAB — SAR COV2 SEROLOGY (COVID19)AB(IGG),IA: SARS CoV2 AB IGG: NEGATIVE

## 2019-02-05 LAB — VITAMIN D 25 HYDROXY (VIT D DEFICIENCY, FRACTURES): Vit D, 25-Hydroxy: 27 ng/mL — ABNORMAL LOW (ref 30–100)

## 2019-02-05 LAB — T3, FREE: T3, Free: 3 pg/mL (ref 2.3–4.2)

## 2019-02-05 LAB — T4, FREE: Free T4: 1.1 ng/dL (ref 0.8–1.8)

## 2019-02-15 ENCOUNTER — Inpatient Hospital Stay: Payer: PRIVATE HEALTH INSURANCE

## 2019-02-15 ENCOUNTER — Inpatient Hospital Stay: Payer: PRIVATE HEALTH INSURANCE | Admitting: Hematology

## 2019-04-20 ENCOUNTER — Emergency Department
Admission: EM | Admit: 2019-04-20 | Discharge: 2019-04-20 | Disposition: A | Discharge status: Home / Self Care | Payer: PRIVATE HEALTH INSURANCE | Source: Home / Self Care | Attending: Family Medicine | Admitting: Family Medicine

## 2019-04-20 ENCOUNTER — Emergency Department (INDEPENDENT_AMBULATORY_CARE_PROVIDER_SITE_OTHER): Payer: PRIVATE HEALTH INSURANCE

## 2019-04-20 ENCOUNTER — Encounter: Payer: Self-pay | Admitting: Emergency Medicine

## 2019-04-20 ENCOUNTER — Other Ambulatory Visit: Payer: Self-pay

## 2019-04-20 DIAGNOSIS — S8391XA Sprain of unspecified site of right knee, initial encounter: Secondary | ICD-10-CM | POA: Diagnosis not present

## 2019-04-20 DIAGNOSIS — M1711 Unilateral primary osteoarthritis, right knee: Secondary | ICD-10-CM | POA: Diagnosis not present

## 2019-04-20 MED ORDER — ACETAMINOPHEN 325 MG PO TABS: 975.0000 mg | ORAL_TABLET | Freq: Once | ORAL | Status: DC

## 2019-04-20 NOTE — ED Triage Notes (Signed)
Patient has had arthritis in right knee causing pain for a week; today injured same knee while trying to push down a stake in the ground with her right foot. In wheelchair from reception to triage room. Has ice pack on knee. No OTCs today. Has had influenza vacc this season. 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

## 2019-04-20 NOTE — ED Provider Notes (Signed)
[facility]    CSN: [id number] Arrival date & time: 04/20/19  1739      History   Chief Complaint Chief Complaint  Patient presents with  . Knee Pain    HPI [name] is a 54 y.o. female.   Patient has a history of knee arthritis.  Today while pushing a stake into ground with her right foot, she heard/felt a painful pop in her right knee.  She has pain with weight bearing and points to her anterior knee as location of pain.  The history is provided by the patient.  Knee Pain Location:  Knee Time since incident:  2 hours Injury: yes   Knee location:  R knee Pain details:    Quality:  Aching   Radiates to:  Does not radiate   Severity:  Moderate   Onset quality:  Sudden   Duration:  2 hours   Timing:  Constant   Progression:  Unchanged Chronicity:  New Dislocation: yes   Prior injury to area:  No Relieved by:  Nothing Worsened by:  Bearing weight Ineffective treatments:  None tried Associated symptoms: stiffness   Associated symptoms: no back pain, no decreased ROM, no fatigue, no fever, no muscle weakness, no numbness and no swelling   Risk factors: obesity     Past Medical History:  Diagnosis Date  . Abnormal Pap smear of cervix   . Anemia    history of iron deficiency-last 01-26-15. History of" chronic elevated white cell count"  . Hematuria    "blood in urine"  . Hypertension   . Pain in hip joint    left hip pain intermittent  . PPD positive, treated   . Renal cell carcinoma ([place])   . Wound, open    right index finger "injury" -remains -has wrapped with bandage.    Patient Active Problem List   Diagnosis Date Noted  . Subclinical hypothyroidism 02/04/2019  . Hyperlipidemia 02/04/2019  . Fatigue 02/03/2019  . Benign essential hypertension 04/19/2018  . Lumbar degenerative disc disease 05/29/2016  . 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  Hypersomnia with sleep apnea 01/10/2016  . Sleep disorder, circadian, shift work type 01/10/2016  . Excessive daytime sleepiness 12/14/2015  . History of renal cell carcinoma 02/15/2015  . Mass of left kidney 12/12/2014  . 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 Annual physical exam 10/19/2014  . Normocytic anemia 09/21/2014  . Obesity 06/21/2014  . Primary osteoarthritis of both knees 01/02/2014    Past Surgical History:  Procedure Laterality Date  . CARPAL TUNNEL RELEASE Bilateral   . CERVICAL CONIZATION W/BX     "cervical cancer"  . CHOLECYSTECTOMY     laparoscopic  . FOOT SURGERY Right    "neuroma"  . HEEL SPUR SURGERY    . NASAL SINUS SURGERY    . ROBOTIC ASSITED PARTIAL NEPHRECTOMY Left 02/15/2015   Procedure: ROBOTIC ASSITED PARTIAL NEPHRECTOMY;  Surgeon: Raynelle Bring, MD;  Location: [facility];  Service: Urology;  Laterality: Left;    OB History    Gravida  2   Para  1   Term      Preterm      AB  1   Living  1     SAB  1   TAB      Ectopic      Multiple      Live Births  Home Medications    Prior to Admission medications   Medication Sig Start Date End Date Taking? Authorizing Provider  acetaminophen (TYLENOL) 650 MG CR tablet Take 1 tablet (650 mg total) by mouth every 8 (eight) hours as needed for pain. 02/03/19   Silverio Decamp, MD  albuterol (VENTOLIN HFA) 108 (90 Base) MCG/ACT inhaler Inhale into the lungs. 10/05/13   [provider]  buPROPion (WELLBUTRIN XL) 300 MG 24 hr tablet TAKE 1 TABLET BY MOUTH EVERY DAY 10/03/18   Silverio Decamp, MD  psyllium (METAMUCIL SMOOTH TEXTURE) 58.6 % powder Take 1 packet by mouth 3 (three) times daily. 08/02/18   Silverio Decamp, MD  valsartan-hydrochlorothiazide (DIOVAN HCT) 160-25 MG tablet Take 1 tablet by mouth daily. 02/03/19   Silverio Decamp, MD  Vitamin D, Ergocalciferol, (DRISDOL) 1.25 MG (50000 UT) CAPS capsule Take 1 capsule (50,000  Units total) by mouth every 7 (seven) days. Take for 8 total doses(weeks) 02/04/19   Silverio Decamp, MD    Family History Family History  Problem Relation Age of Onset  . Heart disease Mother   . Heart disease Father     Social History Social History   Tobacco Use  . Smoking status: Never Smoker  . Smokeless tobacco: Never Used  Substance Use Topics  . Alcohol use: No    Alcohol/week: 0.0 standard drinks  . Drug use: No     Allergies   Codeine, Topamax [topiramate], Ace inhibitors, and Hydromorphone hcl   Review of Systems Review of Systems  Constitutional: Negative for fatigue and fever.  Musculoskeletal: Positive for stiffness. Negative for back pain.       Right knee pain  All other systems reviewed and are negative.    Physical Exam Triage Vital Signs ED Triage Vitals  Enc Vitals Group     BP 04/20/19 1808 (!) 147/76     Pulse Rate 04/20/19 1808 88     Resp 04/20/19 1808 16     Temp 04/20/19 1808 98.1 F (36.7 C)     Temp Source 04/20/19 1808 Oral     SpO2 04/20/19 1808 99 %     Weight 04/20/19 1809 220 lb 7.4 oz (100 kg)     Height 04/20/19 1809 5\' 3"  (1.6 m)     Head Circumference --      Peak Flow --      Pain Score 04/20/19 1809 7     Pain Loc --      Pain Edu? --      Excl. in [place]? --    No data found.  Updated Vital Signs BP (!) 147/76 (BP Location: Right Arm)   Pulse 88   Temp 98.1 F (36.7 C) (Oral)   Resp 16   Ht 5\' 3"  (1.6 m)   Wt 100 kg   LMP 03/31/2018   SpO2 99%   BMI 39.05 kg/m   Visual Acuity Right Eye Distance:   Left Eye Distance:   Bilateral Distance:    Right Eye Near:   Left Eye Near:    Bilateral Near:     Physical Exam Vitals and nursing note reviewed.  Constitutional:      General: She is not in acute distress.    Appearance: She is obese.  HENT:     Head: Normocephalic.  Eyes:     Pupils: Pupils are equal, round, and reactive to light.  Cardiovascular:     Rate and Rhythm: Normal rate.    Pulmonary:  Effort: Pulmonary effort is normal.  Musculoskeletal:        General: No swelling.     Cervical back: Normal range of motion.     Right knee: Bony tenderness present. No swelling, deformity, ecchymosis, lacerations or crepitus. Decreased range of motion. No tenderness. No LCL laxity or MCL laxity. Normal meniscus and normal patellar mobility.     Comments: Right knee:  No effusion, erythema, or warmth.  Knee stable, negative drawer test.  Tenderness over MCL and LCL.  McMurray test negative.   Skin:    General: Skin is warm and dry.  Neurological:     Mental Status: She is alert.      UC Treatments / Results  Labs (all labs ordered are listed, but only abnormal results are displayed) Labs Reviewed - No data to display  EKG   Radiology DG Knee Complete 4 Views Right  Result Date: 04/20/2019 CLINICAL DATA:  Right knee pain EXAM: RIGHT KNEE - COMPLETE 4+ VIEW COMPARISON:  None. FINDINGS: No fracture or dislocation. There is tricompartmental osteoarthritis most notable in the lateral compartment joint space loss and marginal osteophyte formation. A small knee joint effusion is seen. IMPRESSION: Mild tricompartmental osteoarthritis, most notable in medial compartment. Small knee joint effusion. Electronically Signed   By: Prudencio Pair M.D.   On: 04/20/2019 18:46    Procedures Procedures (including critical care time)  Medications Ordered in UC Medications  acetaminophen (TYLENOL) tablet 975 mg (has no administration in time range)    Initial Impression / Assessment and Plan / UC Course  I have reviewed the triage vital signs and the nursing notes.  Pertinent labs & imaging results that were available during my care of the patient were reviewed by me and considered in my medical decision making (see chart for details).    Ace wrap applied.  Dispensed crutches. Followup with Dr. Aundria Mems ([facility]) in 2 to 5 days.   Final Clinical  Impressions(s) / UC Diagnoses   Final diagnoses:  Sprain of right knee, unspecified ligament, initial encounter     Discharge Instructions     Apply ice pack for 30 minutes every 1 to 2 hours today and tomorrow. Use crutches for about 5 to 7 days.  Wear Ace wrap until swelling decreases.    May take Tylenol as needed for pain.     ED Prescriptions    None        Kandra Nicolas, MD 04/22/19 2227

## 2019-04-20 NOTE — Discharge Instructions (Signed)
Apply ice pack for 30 minutes every 1 to 2 hours today and tomorrow. Use crutches for about 5 to 7 days.  Wear Ace wrap until swelling decreases.    May take Tylenol as needed for pain.

## 2019-05-23 ENCOUNTER — Other Ambulatory Visit: Payer: Self-pay | Admitting: Sports Medicine

## 2019-06-27 ENCOUNTER — Encounter: Payer: Self-pay | Admitting: Sports Medicine

## 2019-06-27 ENCOUNTER — Other Ambulatory Visit: Payer: Self-pay

## 2019-06-27 ENCOUNTER — Ambulatory Visit (INDEPENDENT_AMBULATORY_CARE_PROVIDER_SITE_OTHER): Payer: PRIVATE HEALTH INSURANCE | Admitting: Sports Medicine

## 2019-06-27 DIAGNOSIS — R55 Syncope and collapse: Secondary | ICD-10-CM

## 2019-06-27 DIAGNOSIS — E039 Hypothyroidism, unspecified: Secondary | ICD-10-CM

## 2019-06-27 DIAGNOSIS — E038 Other specified hypothyroidism: Secondary | ICD-10-CM

## 2019-06-27 DIAGNOSIS — M170 Bilateral primary osteoarthritis of knee: Secondary | ICD-10-CM

## 2019-06-27 LAB — TROPONIN I: Troponin I: <0.01 ng/mL (ref <=0.0)

## 2019-06-27 MED ORDER — LEVOTHYROXINE SODIUM 50 MCG PO TABS: 50.0000 ug | ORAL_TABLET | Freq: Every day | ORAL | 3 refills | Status: DC

## 2019-06-27 NOTE — Assessment & Plan Note (Signed)
TSH levels are very high, please add T3 and T4 levels, I will probably start 50 mcg of levothyroxine at this point, recheck TSH in 6 weeks.  I am going to go ahead and add the diagnosis of hypothyroidism.

## 2019-06-27 NOTE — Progress Notes (Addendum)
    Procedures performed today:    Procedure: Real-time Ultrasound Guided injection of the right knee Device: Samsung HS60  Verbal informed consent obtained.  Time-out conducted.  Noted no overlying erythema, induration, or other signs of local infection.  Skin prepped in a sterile fashion.  Local anesthesia: Topical Ethyl chloride.  With sterile technique and under real time ultrasound guidance: 1 cc Kenalog 40, 2 cc lidocaine, 2 cc bupivacaine injected easily Completed without difficulty  Pain immediately resolved suggesting accurate placement of the medication.  Advised to call if fevers/chills, erythema, induration, drainage, or persistent bleeding.  Images permanently stored and available for review in the ultrasound unit.  Impression: Technically successful ultrasound guided injection.  Independent interpretation of notes and tests performed by another provider:   None.  Brief History, Exam, Impression, and Recommendations:    Primary osteoarthritis of both knees Shin and took a misstep, she felt pain in her right knee. She has known knee osteoarthritis, she was seen in urgent care, and referred back to me for further evaluation. On exam she has some pain at the medial joint line, ligamentous structures are stable, I injected her knee today, this was last done in 2018. Return in 1 month for this.  Micturition syncope Ivin Booty has also had 2 episodes of syncope, the first was when urinating, the second was when talking to her neighbor, she denies any orthostasis, no palpitations, she does go to the gym and exercise and has no chest pain, shortness of breath when exercising. She did not experience any prodromal symptoms, she was not confused after, no tongue biting, no incontinence. She did feel nauseated and sweaty however. She likely vagal while talking to her neighbor and had micturition syncope. Adding some labs, CBC, CMP, TSH, cardiac panel, D-dimer. She will keep her self  well-hydrated and we can watch this for now.  Subclinical hypothyroidism TSH levels are very high, please add T3 and T4 levels, I will probably start 50 mcg of levothyroxine at this point, recheck TSH in 6 weeks.  I am going to go ahead and add the diagnosis of hypothyroidism.    ___________________________________________ Gwen Her. Dianah Field, M.D., ABFM., CAQSM. Primary Care and [place] Instructor of [place] of [facility] of Medicine

## 2019-06-27 NOTE — Assessment & Plan Note (Signed)
[name] and took a Loss adjuster, chartered, she felt pain in her right knee. She has known knee osteoarthritis, she was seen in urgent care, and referred back to me for further evaluation. On exam she has some pain at the medial joint line, ligamentous structures are stable, I injected her knee today, this was last done in 2018. Return in 1 month for this.

## 2019-06-27 NOTE — Assessment & Plan Note (Signed)
[name] has also had 2 episodes of syncope, the first was when urinating, the second was when talking to her neighbor, she denies any orthostasis, no palpitations, she does go to the gym and exercise and has no chest pain, shortness of breath when exercising. She did not experience any prodromal symptoms, she was not confused after, no tongue biting, no incontinence. She did feel nauseated and sweaty however. She likely vagal while talking to her neighbor and had micturition syncope. Adding some labs, CBC, CMP, TSH, cardiac panel, D-dimer. She will keep her self well-hydrated and we can watch this for now.

## 2019-06-27 NOTE — Addendum Note (Signed)
Addended by: Silverio Decamp on: 06/27/2019 03:26 PM   Modules accepted: Orders

## 2019-06-28 LAB — T4, FREE: Free T4: 1.4 ng/dL (ref 0.8–1.8)

## 2019-06-28 LAB — CK TOTAL AND CKMB (NOT AT ARMC)
CK, MB: 1.6 ng/mL (ref 0–5.0)
Relative Index: 1.3 (ref 0–4.0)
Total CK: 128 U/L (ref 29–143)

## 2019-06-28 LAB — CBC WITH DIFFERENTIAL/PLATELET
Absolute Monocytes: 679 cells/uL (ref 200–950)
Basophils Absolute: 105 cells/uL (ref 0–200)
Basophils Relative: 0.9 %
Eosinophils Absolute: 491 cells/uL (ref 15–500)
Eosinophils Relative: 4.2 %
HCT: 34.2 % — ABNORMAL LOW (ref 35.0–45.0)
Hemoglobin: 11.2 g/dL — ABNORMAL LOW (ref 11.7–15.5)
Lymphs Abs: 3487 cells/uL (ref 850–3900)
MCH: 29.6 pg (ref 27.0–33.0)
MCHC: 32.7 g/dL (ref 32.0–36.0)
MCV: 90.5 fL (ref 80.0–100.0)
MPV: 9.7 fL (ref 7.5–12.5)
Monocytes Relative: 5.8 %
Neutro Abs: 6938 cells/uL (ref 1500–7800)
Neutrophils Relative %: 59.3 %
Platelets: 346 10*3/uL (ref 140–400)
RBC: 3.78 10*6/uL — ABNORMAL LOW (ref 3.80–5.10)
RDW: 12.8 % (ref 11.0–15.0)
Total Lymphocyte: 29.8 %
WBC: 11.7 10*3/uL — ABNORMAL HIGH (ref 3.8–10.8)

## 2019-06-28 LAB — COMPLETE METABOLIC PANEL WITH GFR
AG Ratio: 1.5 (calc) (ref 1.0–2.5)
ALT: 14 U/L (ref 6–29)
AST: 14 U/L (ref 10–35)
Albumin: 4.2 g/dL (ref 3.6–5.1)
Alkaline phosphatase (APISO): 96 U/L (ref 37–153)
BUN/Creatinine Ratio: 26 (calc) — ABNORMAL HIGH (ref 6–22)
BUN: 32 mg/dL — ABNORMAL HIGH (ref 7–25)
CO2: 30 mmol/L (ref 20–32)
Calcium: 10 mg/dL (ref 8.6–10.4)
Chloride: 100 mmol/L (ref 98–110)
Creat: 1.25 mg/dL — ABNORMAL HIGH (ref 0.50–1.05)
GFR, Est African American: 58 mL/min/{1.73_m2} — ABNORMAL LOW (ref >=60)
GFR, Est Non African American: 50 mL/min/{1.73_m2} — ABNORMAL LOW (ref >=60)
Globulin: 2.8 g/dL (calc) (ref 1.9–3.7)
Glucose, Bld: 89 mg/dL (ref 65–99)
Potassium: 4.1 mmol/L (ref 3.5–5.3)
Sodium: 138 mmol/L (ref 135–146)
Total Bilirubin: 0.4 mg/dL (ref 0.2–1.2)
Total Protein: 7 g/dL (ref 6.1–8.1)

## 2019-06-28 LAB — TSH: TSH: 6.37 mIU/L — ABNORMAL HIGH

## 2019-06-28 LAB — T3, FREE: T3, Free: 3 pg/mL (ref 2.3–4.2)

## 2019-06-28 LAB — TEST AUTHORIZATION

## 2019-06-28 LAB — D-DIMER, QUANTITATIVE: D-Dimer, Quant: 0.28 mcg/mL FEU (ref <0.50)

## 2019-07-04 ENCOUNTER — Other Ambulatory Visit: Payer: Self-pay | Admitting: Sports Medicine

## 2019-07-04 DIAGNOSIS — I10 Essential (primary) hypertension: Secondary | ICD-10-CM

## 2019-07-25 ENCOUNTER — Encounter: Payer: Self-pay | Admitting: Sports Medicine

## 2019-07-25 ENCOUNTER — Ambulatory Visit (INDEPENDENT_AMBULATORY_CARE_PROVIDER_SITE_OTHER): Payer: PRIVATE HEALTH INSURANCE | Admitting: Sports Medicine

## 2019-07-25 ENCOUNTER — Other Ambulatory Visit: Payer: Self-pay

## 2019-07-25 DIAGNOSIS — E039 Hypothyroidism, unspecified: Secondary | ICD-10-CM

## 2019-07-25 DIAGNOSIS — E038 Other specified hypothyroidism: Secondary | ICD-10-CM

## 2019-07-25 DIAGNOSIS — E6609 Other obesity due to excess calories: Secondary | ICD-10-CM

## 2019-07-25 DIAGNOSIS — M170 Bilateral primary osteoarthritis of knee: Secondary | ICD-10-CM | POA: Diagnosis not present

## 2019-07-25 DIAGNOSIS — R55 Syncope and collapse: Secondary | ICD-10-CM

## 2019-07-25 DIAGNOSIS — D649 Anemia, unspecified: Secondary | ICD-10-CM

## 2019-07-25 MED ORDER — METFORMIN HCL ER 500 MG PO TB24: 500.0000 mg | ORAL_TABLET | Freq: Every day | ORAL | 11 refills | Status: DC

## 2019-07-25 MED ORDER — OZEMPIC (0.25 OR 0.5 MG/DOSE) 2 MG/1.5ML ~~LOC~~ SOPN: 1.0000 | PEN_INJECTOR | SUBCUTANEOUS | 1 refills | Status: DC

## 2019-07-25 MED ORDER — PHENTERMINE HCL 37.5 MG PO TABS: ORAL_TABLET | ORAL | 0 refills | Status: DC

## 2019-07-25 NOTE — Assessment & Plan Note (Signed)
We did an injection in the right knee at the last visit, she has improved considerably.

## 2019-07-25 NOTE — Assessment & Plan Note (Signed)
[name] returns, we started 3 of levothyroxine at the last visit, I like to recheck her TSH levels at her follow-up visit in a month, she does have subclinical hypothyroidism. She did not note any change in symptoms/fatigue with the levothyroxine, of note she typically has significant iron deficiency anemia and is starting to crave significant amounts of ice.

## 2019-07-25 NOTE — Assessment & Plan Note (Signed)
No further episodes of micturition syncope, I explained the pathophysiology, she has maintained her hydration. Some of this is likely related to her subclinical hypothyroidism and iron deficiency anemia which we are currently managing.

## 2019-07-25 NOTE — Assessment & Plan Note (Signed)
Restarting Ozempic, Metformin, phentermine. Return monthly for weight checks and refills.

## 2019-07-25 NOTE — Assessment & Plan Note (Signed)
She did not note any change in symptoms/fatigue with the levothyroxine, of note she typically has significant iron deficiency anemia and is starting to crave significant amounts of ice. We will recheck her iron levels and CBC at the 1 month follow-up visit, she declines a check today.

## 2019-07-25 NOTE — Progress Notes (Signed)
    Procedures performed today:    None.  Independent interpretation of notes and tests performed by another provider:   None.  Brief History, Exam, Impression, and Recommendations:    Subclinical hypothyroidism Dominik returns, we started 4 of levothyroxine at the last visit, I like to recheck her TSH levels at her follow-up visit in a month, she does have subclinical hypothyroidism. She did not note any change in symptoms/fatigue with the levothyroxine, of note she typically has significant iron deficiency anemia and is starting to crave significant amounts of ice.  Primary osteoarthritis of both knees We did an injection in the right knee at the last visit, she has improved considerably.  Obesity Restarting Ozempic, Metformin, phentermine. Return monthly for weight checks and refills.   Normocytic anemia She did not note any change in symptoms/fatigue with the levothyroxine, of note she typically has significant iron deficiency anemia and is starting to crave significant amounts of ice. We will recheck her iron levels and CBC at the 1 month follow-up visit, she declines a check today.  Micturition syncope No further episodes of micturition syncope, I explained the pathophysiology, she has maintained her hydration. Some of this is likely related to her subclinical hypothyroidism and iron deficiency anemia which we are currently managing.    ___________________________________________ Gwen Her. Dianah Field, M.D., ABFM., CAQSM. Primary Care and [place] Instructor of [place] of [facility] of Medicine

## 2019-07-26 ENCOUNTER — Telehealth: Payer: Self-pay

## 2019-07-26 NOTE — Telephone Encounter (Signed)
It would be the 1 mg dose, I have not called this in yet, she is going to do the up taper first.  The 1 mg dose is with a different pen.  This is how ozempic is tapered.

## 2019-07-26 NOTE — Telephone Encounter (Signed)
[place] called and they state they are confused by the Ozempic directions. They wanted to know what would be the next mg dose.    Sig: Inject 1 Dose into the skin once a week. Inject 0.25 mg subq qwk for 4 wk then inject 0.5 mg subq qwk x4 wk, then go to full dose pen qwk

## 2019-07-27 NOTE — Telephone Encounter (Signed)
Pharmacy advised  

## 2019-08-23 ENCOUNTER — Encounter: Payer: Self-pay | Admitting: Sports Medicine

## 2019-08-23 ENCOUNTER — Ambulatory Visit (INDEPENDENT_AMBULATORY_CARE_PROVIDER_SITE_OTHER): Payer: PRIVATE HEALTH INSURANCE | Admitting: Sports Medicine

## 2019-08-23 DIAGNOSIS — E6609 Other obesity due to excess calories: Secondary | ICD-10-CM | POA: Diagnosis not present

## 2019-08-23 DIAGNOSIS — E039 Hypothyroidism, unspecified: Secondary | ICD-10-CM

## 2019-08-23 DIAGNOSIS — D649 Anemia, unspecified: Secondary | ICD-10-CM

## 2019-08-23 DIAGNOSIS — E038 Other specified hypothyroidism: Secondary | ICD-10-CM

## 2019-08-23 MED ORDER — PHENTERMINE HCL 37.5 MG PO TABS: ORAL_TABLET | ORAL | 0 refills | Status: DC

## 2019-08-23 NOTE — Assessment & Plan Note (Signed)
Started 50 mcg of levothyroxine a couple months ago, rechecking TSH.

## 2019-08-23 NOTE — Progress Notes (Addendum)
    Procedures performed today:    None.  Independent interpretation of notes and tests performed by another provider:   None.  Brief History, Exam, Impression, and Recommendations:    Obesity Excellent weight loss after the first month, she is in fact only taking a half dose of phentermine, refilling medication, return in another month.  Normocytic anemia Rechecking CBC, she was mildly anemic at the last visit, she does have trouble tolerating ferrous sulfate, if persistently anemic we will add ferrous fumarate or ferrous gluconate.  Adding ferrous fumarate.  Subclinical hypothyroidism Started 50 mcg of levothyroxine a couple months ago, rechecking TSH.    ___________________________________________ [name]. Dianah Field, M.D., ABFM., CAQSM. Primary Care and [place] Instructor of [place] of [facility] of Medicine

## 2019-08-23 NOTE — Addendum Note (Signed)
Addended by: Beatris Ship L on: 08/23/2019 09:05 AM   Modules accepted: Orders

## 2019-08-23 NOTE — Assessment & Plan Note (Addendum)
Rechecking CBC, she was mildly anemic at the last visit, she does have trouble tolerating ferrous sulfate, if persistently anemic we will add ferrous fumarate or ferrous gluconate.  Adding ferrous fumarate.

## 2019-08-23 NOTE — Assessment & Plan Note (Signed)
Excellent weight loss after the first month, she is in fact only taking a half dose of phentermine, refilling medication, return in another month.

## 2019-08-24 LAB — CBC
HCT: 32 % — ABNORMAL LOW (ref 35.0–45.0)
Hemoglobin: 10.4 g/dL — ABNORMAL LOW (ref 11.7–15.5)
MCH: 29.8 pg (ref 27.0–33.0)
MCHC: 32.5 g/dL (ref 32.0–36.0)
MCV: 91.7 fL (ref 80.0–100.0)
MPV: 10.2 fL (ref 7.5–12.5)
Platelets: 333 10*3/uL (ref 140–400)
RBC: 3.49 10*6/uL — ABNORMAL LOW (ref 3.80–5.10)
RDW: 12.8 % (ref 11.0–15.0)
WBC: 10.4 10*3/uL (ref 3.8–10.8)

## 2019-08-24 LAB — IRON,TIBC AND FERRITIN PANEL
%SAT: 12 % (calc) — ABNORMAL LOW (ref 16–45)
Ferritin: 69 ng/mL (ref 16–232)
Iron: 34 ug/dL — ABNORMAL LOW (ref 45–160)
TIBC: 283 mcg/dL (calc) (ref 250–450)

## 2019-08-24 LAB — B12 AND FOLATE PANEL
Folate: 10.6 ng/mL
Vitamin B-12: 646 pg/mL (ref 200–1100)

## 2019-08-24 LAB — RETICULOCYTES
ABS Retic: 55840 cells/uL (ref 20000–8000)
Retic Ct Pct: 1.6 %

## 2019-08-24 LAB — TSH: TSH: 1.14 mIU/L

## 2019-08-24 MED ORDER — OZEMPIC (1 MG/DOSE) 2 MG/1.5ML ~~LOC~~ SOPN: 2.0000 mg | PEN_INJECTOR | SUBCUTANEOUS | 11 refills | Status: DC

## 2019-08-24 MED ORDER — FERROUS FUMARATE 324 (106 FE) MG PO TABS: 1.0000 | ORAL_TABLET | Freq: Two times a day (BID) | ORAL | 3 refills | Status: DC

## 2019-08-24 NOTE — Addendum Note (Signed)
Addended by: Silverio Decamp on: 08/24/2019 08:32 AM   Modules accepted: Orders

## 2019-09-09 LAB — HM COLONOSCOPY

## 2019-09-14 ENCOUNTER — Encounter: Payer: Self-pay | Admitting: Sports Medicine

## 2019-09-20 ENCOUNTER — Other Ambulatory Visit: Payer: Self-pay

## 2019-09-20 ENCOUNTER — Ambulatory Visit: Payer: PRIVATE HEALTH INSURANCE | Admitting: Sports Medicine

## 2019-09-20 ENCOUNTER — Ambulatory Visit (INDEPENDENT_AMBULATORY_CARE_PROVIDER_SITE_OTHER): Payer: PRIVATE HEALTH INSURANCE

## 2019-09-20 ENCOUNTER — Encounter: Payer: Self-pay | Admitting: Sports Medicine

## 2019-09-20 DIAGNOSIS — K559 Vascular disorder of intestine, unspecified: Secondary | ICD-10-CM

## 2019-09-20 DIAGNOSIS — E039 Hypothyroidism, unspecified: Secondary | ICD-10-CM

## 2019-09-20 DIAGNOSIS — D649 Anemia, unspecified: Secondary | ICD-10-CM

## 2019-09-20 DIAGNOSIS — K529 Noninfective gastroenteritis and colitis, unspecified: Secondary | ICD-10-CM

## 2019-09-20 DIAGNOSIS — E6609 Other obesity due to excess calories: Secondary | ICD-10-CM | POA: Diagnosis not present

## 2019-09-20 DIAGNOSIS — E038 Other specified hypothyroidism: Secondary | ICD-10-CM

## 2019-09-20 DIAGNOSIS — I10 Essential (primary) hypertension: Secondary | ICD-10-CM

## 2019-09-20 DIAGNOSIS — M170 Bilateral primary osteoarthritis of knee: Secondary | ICD-10-CM

## 2019-09-20 DIAGNOSIS — G47429 Narcolepsy in conditions classified elsewhere without cataplexy: Secondary | ICD-10-CM

## 2019-09-20 MED ORDER — IOPAMIDOL (ISOVUE-370) INJECTION 76%
100.0000 mL | Freq: Once | INTRAVENOUS | Status: AC | PRN
  Administered 2019-09-20: 100 mL via INTRAVENOUS

## 2019-09-20 MED ORDER — FERROUS GLUCONATE 324 (38 FE) MG PO TABS: 324.0000 mg | ORAL_TABLET | Freq: Three times a day (TID) | ORAL | 11 refills | Status: DC

## 2019-09-20 NOTE — Assessment & Plan Note (Signed)
Last injected in April of this year, repeat right knee injection today due to recurrence of pain.

## 2019-09-20 NOTE — Assessment & Plan Note (Signed)
She did have some mild acute renal injury, resolved at discharge. I would like her to hold off on her blood pressure medicine for now, she can send me a MyChart message with her blood pressures over the next week.

## 2019-09-20 NOTE — Assessment & Plan Note (Signed)
We did add ferrous fumarate at the last visit, she never picked this up, too expensive. She is going to get this, rechecking CBC, CMP. Switching to ferrous gluconate with good Rx coupon.

## 2019-09-20 NOTE — Assessment & Plan Note (Signed)
There is likely some element of restless leg syndrome, she is currently iron deficient, and her symptoms have worsened. As above switching to ferrous gluconate as she has had trouble tolerating ferrous sulfate in the past.

## 2019-09-20 NOTE — Progress Notes (Addendum)
    Procedures performed today:    Procedure: Real-time Ultrasound Guided injection of the right knee Device: Samsung HS60  Verbal informed consent obtained.  Time-out conducted.  Noted no overlying erythema, induration, or other signs of local infection.  Skin prepped in a sterile fashion.  Local anesthesia: Topical Ethyl chloride.  With sterile technique and under real time ultrasound guidance: 1 cc Kenalog 40, 2 cc lidocaine, 2 cc bupivacaine injected easily Completed without difficulty  Pain immediately resolved suggesting accurate placement of the medication.  Advised to call if fevers/chills, erythema, induration, drainage, or persistent bleeding.  Images permanently stored and available for review in the ultrasound unit.  Impression: Technically successful ultrasound guided injection.  Independent interpretation of notes and tests performed by another provider:   None.  Brief History, Exam, Impression, and Recommendations:    Primary osteoarthritis of both knees Last injected in April of this year, repeat right knee injection today due to recurrence of pain.  Obesity Oaklee did lose some weight, however because we are considering mesenteric ischemia we will not be refilling her phentermine until we know more.  Normocytic anemia We did add ferrous fumarate at the last visit, she never picked this up, too expensive. She is going to get this, rechecking CBC, CMP. Switching to ferrous gluconate with good Rx coupon.  Subclinical hypothyroidism TSH normal on recheck.  Narcolepsy due to underlying condition without cataplexy There is likely some element of restless leg syndrome, she is currently iron deficient, and her symptoms have worsened. As above switching to ferrous gluconate as she has had trouble tolerating ferrous sulfate in the past.  Colitis Dalanie was recently hospitalized for acute colitis, this was incorporating the transverse and proximal descending  colon. Biopsies were suspicious for ischemic changes. I am going to add mesenteric artery CT angiography. She does report historical episodes of intense abdominal pain. This is a life-threatening process, and we did consider hospitalization during this visit.  Benign essential hypertension She did have some mild acute renal injury, resolved at discharge. I would like her to hold off on her blood pressure medicine for now, she can send me a MyChart message with her blood pressures over the next week.    ___________________________________________ Gwen Her. Dianah Field, M.D., ABFM., CAQSM. Primary Care and [place] Instructor of [place] of [facility] of Medicine

## 2019-09-20 NOTE — Assessment & Plan Note (Signed)
TSH normal on recheck.

## 2019-09-20 NOTE — Assessment & Plan Note (Addendum)
[name] did lose some weight, however because we are considering mesenteric ischemia we will not be refilling her phentermine until we know more.

## 2019-09-20 NOTE — Assessment & Plan Note (Addendum)
[name] was recently hospitalized for acute colitis, this was incorporating the transverse and proximal descending colon. Biopsies were suspicious for ischemic changes. I am going to add mesenteric artery CT angiography. She does report historical episodes of intense abdominal pain. This is a life-threatening process, and we did consider hospitalization during this visit.

## 2019-09-26 ENCOUNTER — Other Ambulatory Visit: Payer: Self-pay

## 2019-09-26 DIAGNOSIS — D649 Anemia, unspecified: Secondary | ICD-10-CM

## 2019-09-26 MED ORDER — FERROUS GLUCONATE 324 (38 FE) MG PO TABS: 324.0000 mg | ORAL_TABLET | Freq: Three times a day (TID) | ORAL | 11 refills | Status: DC

## 2019-10-11 ENCOUNTER — Encounter: Payer: Self-pay | Admitting: Sports Medicine

## 2019-10-11 ENCOUNTER — Ambulatory Visit (INDEPENDENT_AMBULATORY_CARE_PROVIDER_SITE_OTHER): Payer: PRIVATE HEALTH INSURANCE | Admitting: Sports Medicine

## 2019-10-11 ENCOUNTER — Other Ambulatory Visit: Payer: Self-pay | Admitting: Sports Medicine

## 2019-10-11 DIAGNOSIS — I10 Essential (primary) hypertension: Secondary | ICD-10-CM

## 2019-10-11 DIAGNOSIS — E038 Other specified hypothyroidism: Secondary | ICD-10-CM

## 2019-10-11 DIAGNOSIS — D649 Anemia, unspecified: Secondary | ICD-10-CM | POA: Diagnosis not present

## 2019-10-11 DIAGNOSIS — E6609 Other obesity due to excess calories: Secondary | ICD-10-CM | POA: Diagnosis not present

## 2019-10-11 LAB — POCT HEMOGLOBIN: Hemoglobin: 10.6 g/dL — AB (ref 11–14.6)

## 2019-10-11 MED ORDER — LEVOTHYROXINE SODIUM 50 MCG PO TABS: 50.0000 ug | ORAL_TABLET | Freq: Every day | ORAL | 3 refills | Status: DC

## 2019-10-11 MED ORDER — WEGOVY 2.4 MG/0.75ML ~~LOC~~ SOAJ: 2.4000 mg | SUBCUTANEOUS | 11 refills | Status: DC

## 2019-10-11 NOTE — Assessment & Plan Note (Addendum)
Tolerating ferrous gluconate well though not taking the full dose yet. Hemoglobin has increased from 10.4-10.6 over 3 weeks.

## 2019-10-11 NOTE — Progress Notes (Signed)
    Procedures performed today:    None.  Independent interpretation of notes and tests performed by another provider:   None.  Brief History, Exam, Impression, and Recommendations:    Benign essential hypertension Blood pressure medicine restarted, things are looking good.  Normocytic anemia Tolerating ferrous gluconate well though not taking the full dose yet. Hemoglobin has increased from 10.4-10.6 over 3 weeks.   Obesity Switching from Ozempic to [facility] to get additional weight loss. Already doing high-dose Ozempic so we will switch to the full dose Wegovy.    ___________________________________________ Gwen Her. Dianah Field, M.D., ABFM., CAQSM. Primary Care and [place] Instructor of [place] of [facility] of Medicine

## 2019-10-11 NOTE — Assessment & Plan Note (Signed)
Blood pressure medicine restarted, things are looking good.

## 2019-10-11 NOTE — Assessment & Plan Note (Signed)
Switching from Ozempic to [facility] to get additional weight loss. Already doing high-dose Ozempic so we will switch to the full dose Wegovy.

## 2019-11-03 DIAGNOSIS — M5136 Other intervertebral disc degeneration, lumbar region: Secondary | ICD-10-CM

## 2019-11-03 DIAGNOSIS — M51369 Other intervertebral disc degeneration, lumbar region without mention of lumbar back pain or lower extremity pain: Secondary | ICD-10-CM

## 2019-11-03 DIAGNOSIS — M170 Bilateral primary osteoarthritis of knee: Secondary | ICD-10-CM

## 2019-11-16 ENCOUNTER — Telehealth: Payer: Self-pay | Admitting: Sports Medicine

## 2019-11-16 NOTE — Telephone Encounter (Signed)
Hi [name], feel free to send this to the med cost representative you have been communicating with [place] [email or web address].  _________________________________________________  [name] is a 54 year old female, she has been managed longitudinally for her back pain over the past 3 years, more recently she is having increasing severe low back pain and progressive weakness in her right leg.  This is the rationale behind ordering an urgent lumbar spine MRI.  The delay of approval by the patient's insurance company Environmental education officer) is placing the patient at risk for permanent neurologic dysfunction, this puts her insurance company Environmental education officer) and Risk analyst at major risk for legal recourse should she be left with any permanent neurologic deficit such as weakness.  With regards to her right knee, she has osteoarthritis on x-rays, she has had conservative treatment in the form of greater than 6 weeks of physician directed conservative measures, she has also had steroid injections, unfortunately she has persistent pain and we are considering a surgical procedure such as an arthroscopy.  This is the rationale behind getting an MRI for her knee.  ___________________________________________ Gwen Her. Dianah Field, M.D., ABFM., CAQSM. Primary Care and Sports Medicine [facility]  Adjunct Professor of [place] of [facility] of Medicine  ___________________________________________________

## 2019-11-16 NOTE — Telephone Encounter (Signed)
This has been faxed to [place] with [facility]

## 2020-01-04 ENCOUNTER — Other Ambulatory Visit: Payer: Self-pay | Admitting: Sports Medicine

## 2020-01-04 DIAGNOSIS — I10 Essential (primary) hypertension: Secondary | ICD-10-CM

## 2020-01-04 MED ORDER — VALSARTAN-HYDROCHLOROTHIAZIDE 160-25 MG PO TABS: 1.0000 | ORAL_TABLET | Freq: Every day | ORAL | 3 refills | Status: DC

## 2020-01-10 ENCOUNTER — Other Ambulatory Visit: Payer: Self-pay

## 2020-01-10 MED ORDER — BUPROPION HCL ER (XL) 300 MG PO TB24: 300.0000 mg | ORAL_TABLET | Freq: Every day | ORAL | 1 refills | Status: DC

## 2020-01-11 ENCOUNTER — Ambulatory Visit (INDEPENDENT_AMBULATORY_CARE_PROVIDER_SITE_OTHER): Payer: PRIVATE HEALTH INSURANCE | Admitting: Sports Medicine

## 2020-01-11 DIAGNOSIS — M5136 Other intervertebral disc degeneration, lumbar region: Secondary | ICD-10-CM

## 2020-01-11 DIAGNOSIS — M51369 Other intervertebral disc degeneration, lumbar region without mention of lumbar back pain or lower extremity pain: Secondary | ICD-10-CM

## 2020-01-11 DIAGNOSIS — E6609 Other obesity due to excess calories: Secondary | ICD-10-CM

## 2020-01-11 NOTE — Assessment & Plan Note (Signed)
15 pound weight loss after 3 months with [facility], she is currently on full dose, return to see me in 3 months. Good tolerance.

## 2020-01-11 NOTE — Assessment & Plan Note (Signed)
Still having occasional severe pains down the anterolateral thigh, lower leg to the foot. We attempted to get a lumbar spine MRI covered before but her insurance company denied. At this point she is agreeable to simply wait for additional weight loss before considering lumbar spine MRI for interventional planning.

## 2020-01-11 NOTE — Progress Notes (Signed)
    Procedures performed today:    None.  Independent interpretation of notes and tests performed by another provider:   None.  Brief History, Exam, Impression, and Recommendations:    Obesity 15 pound weight loss after 3 months with [facility], she is currently on full dose, return to see me in 3 months. Good tolerance.  Lumbar degenerative disc disease Still having occasional severe pains down the anterolateral thigh, lower leg to the foot. We attempted to get a lumbar spine MRI covered before but her insurance company denied. At this point she is agreeable to simply wait for additional weight loss before considering lumbar spine MRI for interventional planning.    ___________________________________________ Gwen Her. Dianah Field, M.D., ABFM., CAQSM. Primary Care and [place] Instructor of [place] of [facility] of Medicine

## 2020-02-02 ENCOUNTER — Ambulatory Visit (INDEPENDENT_AMBULATORY_CARE_PROVIDER_SITE_OTHER): Payer: PRIVATE HEALTH INSURANCE | Admitting: Nurse Practitioner

## 2020-02-02 ENCOUNTER — Encounter: Payer: Self-pay | Admitting: Nurse Practitioner

## 2020-02-02 ENCOUNTER — Other Ambulatory Visit: Payer: Self-pay

## 2020-02-02 VITALS — BP 115/77 | Ht 63.0 in | Wt 196.0 lb

## 2020-02-02 DIAGNOSIS — I959 Hypotension, unspecified: Secondary | ICD-10-CM | POA: Insufficient documentation

## 2020-02-02 DIAGNOSIS — R55 Syncope and collapse: Secondary | ICD-10-CM

## 2020-02-02 DIAGNOSIS — R42 Dizziness and giddiness: Secondary | ICD-10-CM | POA: Diagnosis not present

## 2020-02-02 NOTE — Assessment & Plan Note (Addendum)
Most likely etiology due to blood pressure medications and recent weight loss. There is a component that appears to be vasovagal related after bowel movement.  Orthostatics in the office today were 115/77 (lay), 110/64 (sit), 114/78 (stand) with mild dizziness with movement from each position.  See Hypotension for full assessment and plan.

## 2020-02-02 NOTE — Progress Notes (Signed)
Acute Office Visit  Subjective:    Patient ID: JAMYIAH LABELLA, female    DOB: 14-Feb-1966, 54 y.o.   MRN: [id number]  Chief Complaint  Patient presents with  . Dizziness    HPI Patient is in today for dizziness and low blood pressure readings for the past several day. She endorses episodes of dizziness when going from a squated or seated position to standing.  While at work yesterday she experienced abdominal pain and had a bowel movement, shortly after the bowel movement she experienced dizziness, sweating, and a feeling as though she was going to lose consciousness. She did not lose consciousness, but her vision did go black. She reports this has intermittently occurred since her 20's with no known cause. She also reports she developed a headache.  Her blood pressure this morning was 94/72. She drank several glasses of water and rechecked it at 106/73. She has not had her BP medication today.   She has had recent weight loss of about 40 pounds since May of this year. She has been on Ozempic and Wegovy to help with this.   She denies chest pain, palpitations, shortness of breath, or recent loss of consciousness. No recent illness.   Past Medical History:  Diagnosis Date  . Abnormal Pap smear of cervix   . Anemia    history of iron deficiency-last 01-26-15. History of" chronic elevated white cell count"  . Hematuria    "blood in urine"  . Hypertension   . Pain in hip joint    left hip pain intermittent  . PPD positive, treated   . Renal cell carcinoma ([place])   . Wound, open    right index finger "injury" -remains -has wrapped with bandage.    Past Surgical History:  Procedure Laterality Date  . CARPAL TUNNEL RELEASE Bilateral   . CERVICAL CONIZATION W/BX     "cervical cancer"  . CHOLECYSTECTOMY     laparoscopic  . FOOT SURGERY Right    "neuroma"  . HEEL SPUR SURGERY    . NASAL SINUS SURGERY    . ROBOTIC ASSITED PARTIAL NEPHRECTOMY Left 02/15/2015   Procedure: ROBOTIC  ASSITED PARTIAL NEPHRECTOMY;  Surgeon: Raynelle Bring, MD;  Location: [facility];  Service: Urology;  Laterality: Left;    Family History  Problem Relation Age of Onset  . Heart disease Mother   . Heart disease Father     Social History   Socioeconomic History  . Marital status: Married    Spouse name: Not on file  . Number of children: Not on file  . Years of education: Not on file  . Highest education level: Not on file  Occupational History  . Occupation: CNA  Tobacco Use  . Smoking status: Never Smoker  . Smokeless tobacco: Never Used  Vaping Use  . Vaping Use: Never used  Substance and Sexual Activity  . Alcohol use: No    Alcohol/week: 0.0 standard drinks  . Drug use: No  . Sexual activity: Yes    Partners: Male    Comment: vasectomy  Other Topics Concern  . Not on file  Social History Narrative  . Not on file   Social Determinants of Health   Financial Resource Strain:   . Difficulty of Paying Living Expenses: Not on file  Food Insecurity:   . Worried About Charity fundraiser in the Last Year: Not on file  . Ran Out of Food in the Last Year: Not on file  Transportation Needs:   .  Lack of Transportation (Medical): Not on file  . Lack of Transportation (Non-Medical): Not on file  Physical Activity:   . Days of Exercise per Week: Not on file  . Minutes of Exercise per Session: Not on file  Stress:   . Feeling of Stress : Not on file  Social Connections:   . Frequency of Communication with Friends and Family: Not on file  . Frequency of Social Gatherings with Friends and Family: Not on file  . Attends Religious Services: Not on file  . Active Member of Clubs or Organizations: Not on file  . Attends Archivist Meetings: Not on file  . Marital Status: Not on file  Intimate Partner Violence:   . Fear of Current or Ex-Partner: Not on file  . Emotionally Abused: Not on file  . Physically Abused: Not on file  . Sexually Abused: Not on file     Outpatient Medications Prior to Visit  Medication Sig Dispense Refill  . acetaminophen (TYLENOL) 650 MG CR tablet Take 1 tablet (650 mg total) by mouth every 8 (eight) hours as needed for pain. 90 tablet 3  . buPROPion (WELLBUTRIN XL) 300 MG 24 hr tablet Take 1 tablet (300 mg total) by mouth daily. 90 tablet 1  . ferrous gluconate (FERGON) 324 MG tablet Take 1 tablet (324 mg total) by mouth 3 (three) times daily with meals. 90 tablet 11  . levothyroxine (SYNTHROID) 50 MCG tablet Take 1 tablet (50 mcg total) by mouth daily. 90 tablet 3  . metFORMIN (GLUCOPHAGE XR) 500 MG 24 hr tablet Take 1 tablet (500 mg total) by mouth daily with breakfast. 30 tablet 11  . psyllium (METAMUCIL SMOOTH TEXTURE) 58.6 % powder Take 1 packet by mouth 3 (three) times daily. 283 g 12  . valsartan-hydrochlorothiazide (DIOVAN-HCT) 160-25 MG tablet Take 1 tablet by mouth daily. 90 tablet 3  . WEGOVY 2.4 MG/0.75ML SOAJ Inject 2.4 mg into the skin once a week. 3 mL 11  . phentermine (ADIPEX-P) 37.5 MG tablet One tab by mouth qAM 30 tablet 0   No facility-administered medications prior to visit.    Allergies  Allergen Reactions  . Codeine Nausea And Vomiting  . Topamax [Topiramate] Other (See Comments)    Intolerant even after 2 weeks  . Ace Inhibitors Cough  . Hydromorphone Hcl Nausea And Vomiting    Review of Systems All review of systems negative except what is listed in the HPI     Objective:    Physical Exam Vitals and nursing note reviewed.  Constitutional:      Appearance: Normal appearance.  HENT:     Head: Normocephalic.  Eyes:     Extraocular Movements: Extraocular movements intact.     Conjunctiva/sclera: Conjunctivae normal.     Pupils: Pupils are equal, round, and reactive to light.  Cardiovascular:     Rate and Rhythm: Normal rate and regular rhythm.     Pulses: Normal pulses.     Heart sounds: Normal heart sounds.  Pulmonary:     Effort: Pulmonary effort is normal.     Breath  sounds: Normal breath sounds.  Abdominal:     General: Abdomen is flat. Bowel sounds are normal.     Palpations: Abdomen is soft.  Musculoskeletal:        General: Normal range of motion.     Cervical back: Normal range of motion.     Right lower leg: No edema.     Left lower leg: No edema.  Skin:    General: Skin is warm and dry.     Capillary Refill: Capillary refill takes less than 2 seconds.     Coloration: Skin is not pale.  Neurological:     General: No focal deficit present.     Mental Status: She is alert and oriented to person, place, and time.     Motor: No weakness.     Gait: Gait normal.  Psychiatric:        Mood and Affect: Mood normal.        Behavior: Behavior normal.        Thought Content: Thought content normal.        Judgment: Judgment normal.     Ht 5' 3"  (1.6 m)   Wt 196 lb (88.9 kg)   LMP 03/31/2018   BMI 34.72 kg/m  Wt Readings from Last 3 Encounters:  02/02/20 196 lb (88.9 kg)  01/11/20 197 lb (89.4 kg)  10/11/19 (!) 212 lb (96.2 kg)    Health Maintenance Due  Topic Date Due  . Hepatitis C Screening  Never done  . COVID-19 Vaccine (1) Never done  . PAP SMEAR-Modifier  03/28/2020    There are no preventive care reminders to display for this patient.   Lab Results  Component Value Date   TSH 1.14 08/23/2019   Lab Results  Component Value Date   WBC 10.4 08/23/2019   HGB 10.6 (A) 10/11/2019   HCT 32.0 (L) 08/23/2019   MCV 91.7 08/23/2019   PLT 333 08/23/2019   Lab Results  Component Value Date   NA 138 06/27/2019   K 4.1 06/27/2019   CHLORIDE 102 06/08/2015   CO2 30 06/27/2019   GLUCOSE 89 06/27/2019   BUN 32 (H) 06/27/2019   CREATININE 1.25 (H) 06/27/2019   BILITOT 0.4 06/27/2019   ALKPHOS 98 08/12/2018   AST 14 06/27/2019   ALT 14 06/27/2019   PROT 7.0 06/27/2019   ALBUMIN 4.3 08/12/2018   CALCIUM 10.0 06/27/2019   ANIONGAP 8 08/12/2018   EGFR 64 (L) 06/08/2015   Lab Results  Component Value Date   CHOL 235 (H)  02/03/2019   Lab Results  Component Value Date   HDL 56 02/03/2019   Lab Results  Component Value Date   LDLCALC 141 (H) 02/03/2019   Lab Results  Component Value Date   TRIG 238 (H) 02/03/2019   Lab Results  Component Value Date   CHOLHDL 4.2 02/03/2019   Lab Results  Component Value Date   HGBA1C 5.7 (H) 04/19/2018       Assessment & Plan:   Problem List Items Addressed This Visit      Cardiovascular and Mediastinum   Hypotension - Primary    Symptoms consistent with hypotension, most likely related to recent weight loss and blood pressure medications. She does not appear dehydrated on exam today. Her pre-syncopal episode at work is consistent with a vasovagal response. No signs of cardiac abnormality today.  Plan to stop Valsartan-HCTZ 160-47m for now and monitoring blood pressures at home daily.  Goal of blood pressures around 1426'Sto 1341'Dsystolic and 762'I-29'Ndiastolic. She is there in the office today without taking any of her medications. I suspect that her blood pressure is more regulated now that she has lost weight. If symptoms persist despite stopping her antihypertensives, will need further work-up. If blood pressure begins to rise without medication, discussed with patient that we will consider starting back at a 1/2 dose to see  if that is sufficient.  Visit scheduled with PCP in about 6 weeks. Follow-up sooner if needed.        Postural dizziness with presyncope    Most likely etiology due to blood pressure medications and recent weight loss. There is a component that appears to be vasovagal related after bowel movement.  Orthostatics in the office today were 115/77 (lay), 110/64 (sit), 114/78 (stand) with mild dizziness with movement from each position.  See Hypotension for full assessment and plan.           Orma Render, NP

## 2020-02-02 NOTE — Assessment & Plan Note (Addendum)
Symptoms consistent with hypotension, most likely related to recent weight loss and blood pressure medications. She does not appear dehydrated on exam today. Her pre-syncopal episode at work is consistent with a vasovagal response. No signs of cardiac abnormality today.  Plan to stop Valsartan-HCTZ 160-25mg  for now and monitoring blood pressures at home daily.  Goal of blood pressures around 361'Q to 244'L systolic and 75'P-00'F diastolic. She is there in the office today without taking any of her medications. I suspect that her blood pressure is more regulated now that she has lost weight. If symptoms persist despite stopping her antihypertensives, will need further work-up. If blood pressure begins to rise without medication, discussed with patient that we will consider starting back at a 1/2 dose to see if that is sufficient.  Visit scheduled with PCP in about 6 weeks. Follow-up sooner if needed.

## 2020-02-02 NOTE — Patient Instructions (Addendum)
Let's plan to stop taking the blood pressure medications for now and monitor your blood pressures at home. If your blood pressure stays around 120's/70-80's then great! If it looks like it starts to creep back up let me know and we can plan on starting at 1/2 dose - cut the pill in 1/2.

## 2020-03-05 ENCOUNTER — Telehealth: Payer: Self-pay | Admitting: Sports Medicine

## 2020-03-05 NOTE — Telephone Encounter (Signed)
I think I should be diverting antibody infusion questions to you, what do you think?  Candidate?  She had does have a history of renal cell carcinoma, but not undergoing any chemotherapy, surgical cure, no other obvious immunosuppression.  Not on Metformin for diabetes.

## 2020-03-05 NOTE — Telephone Encounter (Signed)
Patient called and left a Voicemail stating she tested positive for Covid and was wondering if she could have the infusion and wanted to check with her pcp. Please call patient and advise.

## 2020-03-05 NOTE — Telephone Encounter (Signed)
I will submit her information for referral and we can always see! Looks like she is unvaccinated and would qualify due to BMI. Thanks for sending the information.

## 2020-03-06 ENCOUNTER — Telehealth (HOSPITAL_COMMUNITY): Payer: Self-pay | Admitting: Nurse Practitioner

## 2020-03-06 DIAGNOSIS — U071 COVID-19: Secondary | ICD-10-CM

## 2020-03-06 NOTE — Telephone Encounter (Signed)
Called to Discuss with patient about Covid symptoms and the use of a monoclonal antibody infusion for those with mild to moderate Covid symptoms and at a high risk of hospitalization.     Pt is qualified for this infusion at the [facility] infusion center due to co-morbid conditions and/or a member of an at-risk group.     Unable to reach pt. Left message to return call. Sent [employer].   Beckey Rutter, Allison, AGNP-C [phone] ([place])

## 2020-03-19 ENCOUNTER — Encounter: Payer: Self-pay | Admitting: Sports Medicine

## 2020-03-19 ENCOUNTER — Telehealth (INDEPENDENT_AMBULATORY_CARE_PROVIDER_SITE_OTHER): Payer: PRIVATE HEALTH INSURANCE | Admitting: Sports Medicine

## 2020-03-19 DIAGNOSIS — U071 COVID-19: Secondary | ICD-10-CM | POA: Insufficient documentation

## 2020-03-19 DIAGNOSIS — J1282 Pneumonia due to coronavirus disease 2019: Secondary | ICD-10-CM

## 2020-03-19 MED ORDER — PREDNISONE 10 MG (48) PO TBPK: ORAL_TABLET | Freq: Every day | ORAL | 0 refills | Status: DC

## 2020-03-19 NOTE — Progress Notes (Signed)
   Virtual Visit via WebEx/MyChart   I connected with  [name]  on 03/19/20 via WebEx/MyChart/Doximity Video and verified that I am speaking with the correct person using two identifiers.   I discussed the limitations, risks, security and privacy concerns of performing an evaluation and management service by WebEx/MyChart/Doximity Video, including the higher likelihood of inaccurate diagnosis and treatment, and the availability of in person appointments.  We also discussed the likely need of an additional face to face encounter for complete and high quality delivery of care.  I also discussed with the patient that there may be a patient responsible charge related to this service. The patient expressed understanding and wishes to proceed.  Provider location is in medical facility. Patient location is at their home, different from provider location. People involved in care of the patient during this telehealth encounter were myself, my nurse/medical assistant, and my front office/scheduling team member.  Review of Systems: No fevers, chills, night sweats, weight loss, chest pain, or shortness of breath.   Objective Findings:    General: Speaking full sentences, no audible heavy breathing.  Sounds alert and appropriately interactive.  Appears well.  Face symmetric.  Extraocular movements intact.  Pupils equal and round.  No nasal flaring or accessory muscle use visualized.  Independent interpretation of tests performed by another provider:   None.  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 She was ultimately discharged on 3 L of nasal cannula oxygen. Dexamethasone. She has Symbicort and albuterol. She is  complaining of facial swelling, we are limited on video calls but her face does look somewhat round, my suspicion is this is most likely due to fluids administered during her hospitalization and her steroids. She is speaking full sentences though I can tell that she has some air hunger. She is continuing to be adamant that she should not get the vaccine in spite of her near death experience with COVID-19, I did advise her that simply having the disease was not protection enough and that in several months she would likely need to do the vaccine, she continues to refuse and understands and accepts the risk of death.   No further intervention needed at this juncture. She will hold off on her valsartan, continue Metformin, and come to see me in the office next week sometime.   I discussed the above assessment and treatment plan with the patient. The patient was provided an opportunity to ask questions and all were answered. The patient agreed with the plan and demonstrated an understanding of the instructions.   The patient was advised to call back or seek an in-person evaluation if the symptoms worsen or if the condition fails to improve as anticipated.   I provided 30 minutes of face to face and non-face-to-face time during this encounter date, time was needed to gather information, review chart, records, communicate/coordinate with staff remotely, as well as complete documentation.  Some of the above time was spent performing technical support services on the telephone to help her get the video call working.   ___________________________________________ Ihor Austin. Benjamin Stain, M.D., ABFM., CAQSM. Primary Care and Sports Medicine [facility]  Adjunct Instructor of Family Medicine  [employer]

## 2020-03-19 NOTE — Assessment & Plan Note (Signed)
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 She was ultimately discharged on 3 L of nasal cannula oxygen. Dexamethasone. She has Symbicort and albuterol. She is complaining of facial swelling, we are limited on video calls but her face does look somewhat round, my suspicion is this is most likely due to fluids administered during her hospitalization and her steroids. She is speaking full sentences though I can tell that she has some air hunger. She is continuing to be adamant that she should not get the vaccine in spite of her near death experience with COVID-19, I did advise her that simply having the disease was not protection enough and that in several months she would likely need to do the vaccine, she continues to refuse and understands and accepts the risk of death.   No further intervention needed at this juncture. She will hold off on her valsartan, continue Metformin, and come to see me in the office next week sometime.

## 2020-03-21 ENCOUNTER — Ambulatory Visit (INDEPENDENT_AMBULATORY_CARE_PROVIDER_SITE_OTHER): Payer: PRIVATE HEALTH INSURANCE

## 2020-03-21 ENCOUNTER — Telehealth (INDEPENDENT_AMBULATORY_CARE_PROVIDER_SITE_OTHER): Payer: PRIVATE HEALTH INSURANCE | Admitting: Sports Medicine

## 2020-03-21 DIAGNOSIS — D649 Anemia, unspecified: Secondary | ICD-10-CM

## 2020-03-21 DIAGNOSIS — U071 COVID-19: Secondary | ICD-10-CM | POA: Diagnosis not present

## 2020-03-21 DIAGNOSIS — J1282 Pneumonia due to coronavirus disease 2019: Secondary | ICD-10-CM | POA: Diagnosis not present

## 2020-03-21 NOTE — Assessment & Plan Note (Addendum)
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 Not intubated. Discharged on 3 L to 4 L oxygen nasal cannula. We added dexamethasone, she was already taking Symbicort and albuterol. Her initial complaint was facial swelling on a video visit from 2 days ago. She also had some air hunger. We suspected this was likely due to fluids administered during her hospitalization and steroids, she has no vital signs for me today and does not even have a weight. She is an adamant antivaxxer with regards to the Covid vaccine. She feels terrible, she has air hunger, her hair is falling out, her body is swelling and she desats to the 70s with ambulation. 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 We are going to get some labs, CBC, CMP, BNP, D-dimer. We can get a chest x-ray as well. We do not yet have her FMLA paperwork that she needed filled out today. We can do a telephone visit if I get her FMLA paperwork anytime soon.  We did end up getting her FMLA paperwork later in the morning, I called [name], we filled out the [facility] paperwork and it will be faxed and scanned.  BNP is also elevated concerning for heart failure/cor pulmonale, adding echocardiogram.

## 2020-03-21 NOTE — Progress Notes (Addendum)
Virtual Visit via WebEx/MyChart   I connected with  [name]  on 03/21/2020 via WebEx/MyChart/Doximity Video and verified that I am speaking with the correct person using two identifiers.   I discussed the limitations, risks, security and privacy concerns of performing an evaluation and management service by WebEx/MyChart/Doximity Video, including the higher likelihood of inaccurate diagnosis and treatment, and the availability of in person appointments.  We also discussed the likely need of an additional face to face encounter for complete and high quality delivery of care.  I also discussed with the patient that there may be a patient responsible charge related to this service. The patient expressed understanding and wishes to proceed.  Provider location is in medical facility. Patient location is at their home, different from provider location. People involved in care of the patient during this telehealth encounter were myself, my nurse/medical assistant, and my front office/scheduling team member.  Review of Systems: No fevers, chills, night sweats, weight loss, chest pain, or shortness of breath.   Objective Findings:    General: Speaking full sentences, no audible heavy breathing.  Sounds alert and appropriately interactive.  Appears well.  Face symmetric.  Extraocular movements intact.  Pupils equal and round.  No nasal flaring or accessory muscle use visualized.  Independent interpretation of tests performed by another provider:   None.  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 Not intubated. Discharged on 3 L to 4 L oxygen nasal cannula. We added dexamethasone, she was already taking Symbicort and albuterol. Her initial complaint was facial  swelling on a video visit from 2 days ago. She also had some air hunger. We suspected this was likely due to fluids administered during her hospitalization and steroids, she has no vital signs for me today and does not even have a weight. She is an adamant antivaxxer with regards to the Covid vaccine. She feels terrible, she has air hunger, her hair is falling out, her body is swelling and she desats to the 70s with ambulation. 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 We are going to get some labs, CBC, CMP, BNP, D-dimer. We can get a chest x-ray as well. We do not yet have her FMLA paperwork that she needed filled out today. We can do a telephone visit if I get her FMLA paperwork anytime soon.  We did end up getting her FMLA paperwork later in the morning, I called [name], we filled out the [facility] paperwork and it will be faxed and scanned.  BNP is also elevated concerning for heart failure/cor pulmonale, adding echocardiogram.  Normocytic anemia Hemoglobin is dropping significantly.  I really do need to see her in the office sometime early next week to reevaluate, get another hemoglobin level, potentially do a Hemoccult and see if she is losing blood somewhere. Historically she has run in the mid tens.   I discussed the above assessment and treatment plan with the patient. The patient was provided an opportunity to ask questions and all were answered. The patient agreed with the plan and demonstrated an understanding of the instructions.   The patient was advised to call back or seek an in-person evaluation if the symptoms worsen or if the condition  fails to improve as anticipated.   We are managing life-threatening disease processes at this point, level 5 code.   ___________________________________________ Gwen Her. Dianah Field, M.D., ABFM., CAQSM. Primary Care and [place] Instructor of [place] of [facility] of Medicine

## 2020-03-22 NOTE — Addendum Note (Signed)
Addended by: Monica Becton on: 03/22/2020 12:10 PM   Modules accepted: Orders, Level of Service

## 2020-03-22 NOTE — Assessment & Plan Note (Signed)
Hemoglobin is dropping significantly.  I really do need to see her in the office sometime early next week to reevaluate, get another hemoglobin level, potentially do a Hemoccult and see if she is losing blood somewhere. Historically she has run in the mid tens.

## 2020-03-27 LAB — COMPREHENSIVE METABOLIC PANEL
AG Ratio: 1.4 (calc) (ref 1.0–2.5)
ALT: 62 U/L — ABNORMAL HIGH (ref 6–29)
AST: 31 U/L (ref 10–35)
Albumin: 3 g/dL — ABNORMAL LOW (ref 3.6–5.1)
Alkaline phosphatase (APISO): 72 U/L (ref 37–153)
BUN: 14 mg/dL (ref 7–25)
CO2: 33 mmol/L — ABNORMAL HIGH (ref 20–32)
Calcium: 9 mg/dL (ref 8.6–10.4)
Chloride: 103 mmol/L (ref 98–110)
Creat: 0.9 mg/dL (ref 0.50–1.05)
Globulin: 2.2 g/dL (calc) (ref 1.9–3.7)
Glucose, Bld: 96 mg/dL (ref 65–139)
Potassium: 3.8 mmol/L (ref 3.5–5.3)
Sodium: 142 mmol/L (ref 135–146)
Total Bilirubin: 0.4 mg/dL (ref 0.2–1.2)
Total Protein: 5.2 g/dL — ABNORMAL LOW (ref 6.1–8.1)

## 2020-03-27 LAB — D-DIMER, QUANTITATIVE: D-Dimer, Quant: 0.6 mcg/mL FEU — ABNORMAL HIGH (ref <0.50)

## 2020-03-27 LAB — CBC
HCT: 29.9 % — ABNORMAL LOW (ref 35.0–45.0)
Hemoglobin: 9.6 g/dL — ABNORMAL LOW (ref 11.7–15.5)
MCH: 29.6 pg (ref 27.0–33.0)
MCHC: 32.1 g/dL (ref 32.0–36.0)
MCV: 92.3 fL (ref 80.0–100.0)
MPV: 9.5 fL (ref 7.5–12.5)
Platelets: 358 10*3/uL (ref 140–400)
RBC: 3.24 10*6/uL — ABNORMAL LOW (ref 3.80–5.10)
RDW: 13.5 % (ref 11.0–15.0)
WBC: 11.5 10*3/uL — ABNORMAL HIGH (ref 3.8–10.8)

## 2020-03-27 LAB — PROCALCITONIN: Procalcitonin: <0.1 ng/mL (ref <0.10)

## 2020-03-27 LAB — TSH: TSH: 2.77 mIU/L

## 2020-03-27 LAB — BRAIN NATRIURETIC PEPTIDE: Brain Natriuretic Peptide: 116 pg/mL — ABNORMAL HIGH (ref <100)

## 2020-04-02 ENCOUNTER — Ambulatory Visit: Payer: PRIVATE HEALTH INSURANCE | Admitting: Sports Medicine

## 2020-04-06 ENCOUNTER — Other Ambulatory Visit: Payer: Self-pay | Admitting: Sports Medicine

## 2020-04-06 ENCOUNTER — Ambulatory Visit (INDEPENDENT_AMBULATORY_CARE_PROVIDER_SITE_OTHER): Payer: PRIVATE HEALTH INSURANCE | Admitting: Sports Medicine

## 2020-04-06 ENCOUNTER — Other Ambulatory Visit: Payer: Self-pay

## 2020-04-06 ENCOUNTER — Encounter: Payer: Self-pay | Admitting: Sports Medicine

## 2020-04-06 ENCOUNTER — Ambulatory Visit (INDEPENDENT_AMBULATORY_CARE_PROVIDER_SITE_OTHER): Payer: PRIVATE HEALTH INSURANCE

## 2020-04-06 DIAGNOSIS — J1282 Pneumonia due to coronavirus disease 2019: Secondary | ICD-10-CM

## 2020-04-06 DIAGNOSIS — U071 COVID-19: Secondary | ICD-10-CM | POA: Diagnosis not present

## 2020-04-06 NOTE — Progress Notes (Signed)
    Procedures performed today:    None.  Independent interpretation of notes and tests performed by another provider:   None.  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 She was ultimately discharged on 3 to 4 L of oxygen via nasal cannula, at the last visit we added dexamethasone, she continued her Symbicort and albuterol. We checked labs, her BNP was elevated likely representing cor pulmonale. She is doing better today, she still has significant fatigue, shortness of breath, tachycardia with ambulation, her O2 saturation is 95% at rest, her ambulatory O2 saturation does drop but only to 92% so she should continue use her portable oxygen concentrator and O2 tanks at home to aim for a O2 saturation of greater than 94% when ambulating. Repeat chest x-ray today, repeat labs today. Echocardiogram is scheduled.    ___________________________________________ Gwen Her. Dianah Field, M.D., ABFM., CAQSM. Primary Care and [place] Instructor of [place] of [facility] of Medicine

## 2020-04-06 NOTE — Assessment & Plan Note (Addendum)
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 She was ultimately discharged on 3 to 4 L of oxygen via nasal cannula, at the last visit we added dexamethasone, she continued her Symbicort and albuterol. We checked labs, her BNP was elevated likely representing cor pulmonale. She is doing better today, she still has significant fatigue, shortness of breath, tachycardia with ambulation, her O2 saturation is 95% at rest, her ambulatory O2 saturation does drop but only to 92% so she should continue use her portable oxygen concentrator and O2 tanks at home to aim for a O2 saturation of greater than 94% when ambulating. Repeat chest x-ray today, repeat labs today. Echocardiogram is scheduled.

## 2020-04-07 LAB — COMPREHENSIVE METABOLIC PANEL
AG Ratio: 1.4 (calc) (ref 1.0–2.5)
ALT: 46 U/L — ABNORMAL HIGH (ref 6–29)
AST: 21 U/L (ref 10–35)
Albumin: 3.8 g/dL (ref 3.6–5.1)
Alkaline phosphatase (APISO): 100 U/L (ref 37–153)
BUN: 16 mg/dL (ref 7–25)
CO2: 27 mmol/L (ref 20–32)
Calcium: 9.6 mg/dL (ref 8.6–10.4)
Chloride: 102 mmol/L (ref 98–110)
Creat: 1 mg/dL (ref 0.50–1.05)
Globulin: 2.7 g/dL (calc) (ref 1.9–3.7)
Glucose, Bld: 85 mg/dL (ref 65–139)
Potassium: 4.3 mmol/L (ref 3.5–5.3)
Sodium: 139 mmol/L (ref 135–146)
Total Bilirubin: 0.6 mg/dL (ref 0.2–1.2)
Total Protein: 6.5 g/dL (ref 6.1–8.1)

## 2020-04-07 LAB — CBC
HCT: 33.7 % — ABNORMAL LOW (ref 35.0–45.0)
Hemoglobin: 11.2 g/dL — ABNORMAL LOW (ref 11.7–15.5)
MCH: 31.1 pg (ref 27.0–33.0)
MCHC: 33.2 g/dL (ref 32.0–36.0)
MCV: 93.6 fL (ref 80.0–100.0)
MPV: 10.1 fL (ref 7.5–12.5)
Platelets: 303 10*3/uL (ref 140–400)
RBC: 3.6 10*6/uL — ABNORMAL LOW (ref 3.80–5.10)
RDW: 14.6 % (ref 11.0–15.0)
WBC: 12.2 10*3/uL — ABNORMAL HIGH (ref 3.8–10.8)

## 2020-04-07 LAB — BRAIN NATRIURETIC PEPTIDE: Brain Natriuretic Peptide: 15 pg/mL (ref <100)

## 2020-04-12 ENCOUNTER — Ambulatory Visit: Payer: PRIVATE HEALTH INSURANCE | Admitting: Sports Medicine

## 2020-04-13 ENCOUNTER — Other Ambulatory Visit: Payer: Self-pay

## 2020-04-13 ENCOUNTER — Ambulatory Visit (HOSPITAL_BASED_OUTPATIENT_CLINIC_OR_DEPARTMENT_OTHER)
Admission: RE | Admit: 2020-04-13 | Discharge: 2020-04-13 | Disposition: A | Discharge status: Home / Self Care | Payer: PRIVATE HEALTH INSURANCE | Source: Ambulatory Visit | Attending: Sports Medicine | Admitting: Sports Medicine

## 2020-04-13 DIAGNOSIS — J1282 Pneumonia due to coronavirus disease 2019: Secondary | ICD-10-CM | POA: Diagnosis not present

## 2020-04-13 DIAGNOSIS — U071 COVID-19: Secondary | ICD-10-CM | POA: Diagnosis not present

## 2020-04-13 LAB — ECHOCARDIOGRAM COMPLETE
Area-P 1/2: 6.54 cm2
S' Lateral: 3 cm

## 2020-05-08 ENCOUNTER — Encounter: Payer: Self-pay | Admitting: Sports Medicine

## 2020-05-08 ENCOUNTER — Ambulatory Visit (INDEPENDENT_AMBULATORY_CARE_PROVIDER_SITE_OTHER): Payer: PRIVATE HEALTH INSURANCE | Admitting: Sports Medicine

## 2020-05-08 ENCOUNTER — Other Ambulatory Visit: Payer: Self-pay

## 2020-05-08 DIAGNOSIS — J1282 Pneumonia due to coronavirus disease 2019: Secondary | ICD-10-CM

## 2020-05-08 DIAGNOSIS — U071 COVID-19: Secondary | ICD-10-CM

## 2020-05-08 NOTE — Assessment & Plan Note (Signed)
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 We added dexamethasone, continue Symbicort, albuterol, her BNP was elevated at the time likely representing cor pulmonale. She has progressively improved, today she is satting well at rest and with activity, she was a bit tachycardic as expected at her initial visit, her heart rate is in the low 100s today with activity and dropped to the low 90s at rest. At this point she is eager to get back to work, I am okay with this. I do not think we need to intervene with her heart rate at all, her echocardiogram was normal. Return to see me as needed.

## 2020-05-08 NOTE — Progress Notes (Signed)
    Procedures performed today:    None.  Independent interpretation of notes and tests performed by another provider:   None.  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 We added dexamethasone, continue Symbicort, albuterol, her BNP was elevated at the time likely representing cor pulmonale. She has progressively improved, today she is satting well at rest and with activity, she was a bit tachycardic as expected at her initial visit, her heart rate is in the low 100s today with activity and dropped to the low 90s at rest. At this point she is eager to get back to work, I am okay with this. I do not think we need to intervene with her heart rate at all, her echocardiogram was normal. Return to see me as needed.    ___________________________________________ Gwen Her. Dianah Field, M.D., ABFM., CAQSM. Primary Care and [place] Instructor of [place] of [facility] of Medicine

## 2020-06-20 ENCOUNTER — Encounter: Payer: Self-pay | Admitting: Emergency Medicine

## 2020-06-20 ENCOUNTER — Other Ambulatory Visit: Payer: Self-pay

## 2020-06-20 ENCOUNTER — Emergency Department
Admission: EM | Admit: 2020-06-20 | Discharge: 2020-06-20 | Disposition: A | Discharge status: Home / Self Care | Payer: PRIVATE HEALTH INSURANCE | Source: Home / Self Care | Attending: Family Medicine | Admitting: Family Medicine

## 2020-06-20 DIAGNOSIS — R350 Frequency of micturition: Secondary | ICD-10-CM | POA: Diagnosis not present

## 2020-06-20 LAB — POCT URINALYSIS DIP (MANUAL ENTRY)
Bilirubin, UA: NEGATIVE
Blood, UA: NEGATIVE
Glucose, UA: NEGATIVE mg/dL
Leukocytes, UA: NEGATIVE
Nitrite, UA: NEGATIVE
Protein Ur, POC: NEGATIVE mg/dL
Spec Grav, UA: >=1.03 — AB (ref 1.010–1.025)
Urobilinogen, UA: 0.2 E.U./dL
pH, UA: 5.5 (ref 5.0–8.0)

## 2020-06-20 NOTE — Discharge Instructions (Addendum)
Increase fluid intake.  No evidence of urinary infection today.

## 2020-06-20 NOTE — ED Triage Notes (Addendum)
Polyuria x 5 days Weak stream HX Kidney Cancer, 1.5 kidneys

## 2020-06-20 NOTE — ED Provider Notes (Signed)
[name] CARE    CSN: [id number] Arrival date & time: 06/20/20  0909      History   Chief Complaint Chief Complaint  Patient presents with  . Polyuria    HPI [name] is a 55 y.o. female.   Patient is concerned that she might have a UTI.  About a week ago she developed mild URI symptoms including nasal congestion, occasional cough, myalgias, fatigue, mild headache, and chills.  During this time she developed urinary frequency/urgency without dysuria or abdominal/pelvic pain.  Her URI symptoms have mostly resolved as well as her urinary frequency.  The history is provided by the patient.    Past Medical History:  Diagnosis Date  . Abnormal Pap smear of cervix   . Anemia    history of iron deficiency-last 01-26-15. History of" chronic elevated white cell count"  . Hematuria    "blood in urine"  . Hypertension   . Pain in hip joint    left hip pain intermittent  . PPD positive, treated   . Renal cell carcinoma ([place])   . Wound, open    right index finger "injury" -remains -has wrapped with bandage.    Patient Active Problem List   Diagnosis Date Noted  . Pneumonia due to COVID-19 virus 03/19/2020  . Hypotension 02/02/2020  . Postural dizziness with presyncope 02/02/2020  . Colitis 09/20/2019  . Micturition syncope 06/27/2019  . Subclinical hypothyroidism 02/04/2019  . Hyperlipidemia 02/04/2019  . Fatigue 02/03/2019  . Benign essential hypertension 04/19/2018  . Lumbar degenerative disc disease 05/29/2016  . 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 Hypersomnia with sleep apnea 01/10/2016  . Sleep disorder, circadian, shift work type 01/10/2016  . Excessive daytime sleepiness 12/14/2015  . History of renal cell carcinoma 02/15/2015  . Mass of left kidney 12/12/2014  . 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 Annual physical exam 10/19/2014  . Normocytic  anemia 09/21/2014  . Obesity 06/21/2014  . Primary osteoarthritis of both knees 01/02/2014    Past Surgical History:  Procedure Laterality Date  . CARPAL TUNNEL RELEASE Bilateral   . CERVICAL CONIZATION W/BX     "cervical cancer"  . CHOLECYSTECTOMY     laparoscopic  . FOOT SURGERY Right    "neuroma"  . HEEL SPUR SURGERY    . NASAL SINUS SURGERY    . ROBOTIC ASSITED PARTIAL NEPHRECTOMY Left 02/15/2015   Procedure: ROBOTIC ASSITED PARTIAL NEPHRECTOMY;  Surgeon: Raynelle Bring, MD;  Location: [facility];  Service: Urology;  Laterality: Left;    OB History    Gravida  2   Para  1   Term      Preterm      AB  1   Living  1     SAB  1   IAB      Ectopic      Multiple      Live Births               Home Medications    Prior to Admission medications   Medication Sig Start Date End Date Taking? Authorizing Provider  acetaminophen (TYLENOL) 650 MG CR tablet Take 1 tablet (650 mg total) by mouth every 8 (eight) hours as needed for pain. 02/03/19   Silverio Decamp, MD  buPROPion (WELLBUTRIN XL) 300 MG 24 hr tablet Take 1 tablet (300 mg total) by mouth daily. 01/10/20   Silverio Decamp, MD  ferrous  gluconate (FERGON) 324 MG tablet Take 1 tablet (324 mg total) by mouth 3 (three) times daily with meals. 09/26/19   Silverio Decamp, MD  levothyroxine (SYNTHROID) 50 MCG tablet Take 1 tablet (50 mcg total) by mouth daily. 10/11/19   Silverio Decamp, MD  metFORMIN (GLUCOPHAGE XR) 500 MG 24 hr tablet Take 1 tablet (500 mg total) by mouth daily with breakfast. 07/25/19   Silverio Decamp, MD  psyllium (METAMUCIL SMOOTH TEXTURE) 58.6 % powder Take 1 packet by mouth 3 (three) times daily. 08/02/18   Silverio Decamp, MD  WEGOVY 2.4 MG/0.75ML SOAJ Inject 2.4 mg into the skin once a week. 10/11/19   Silverio Decamp, MD    Family History Family History  Problem Relation Age of Onset  . Heart disease Mother   . Heart disease Father      Social History Social History   Tobacco Use  . Smoking status: Never Smoker  . Smokeless tobacco: Never Used  Vaping Use  . Vaping Use: Never used  Substance Use Topics  . Alcohol use: No    Alcohol/week: 0.0 standard drinks  . Drug use: No     Allergies   Codeine, Topamax [topiramate], Ace inhibitors, and Hydromorphone hcl   Review of Systems Review of Systems  Constitutional: Negative.  Negative for unexpected weight change.  HENT: Negative.   Eyes: Negative.   Respiratory: Negative.   Cardiovascular: Negative.   Gastrointestinal: Negative.   Genitourinary: Negative.   Musculoskeletal: Negative.   Skin: Negative.   Neurological: Negative.   Hematological: Negative for adenopathy.     Physical Exam Triage Vital Signs ED Triage Vitals  Enc Vitals Group     BP 06/20/20 1024 127/81     Pulse Rate 06/20/20 1024 86     Resp 06/20/20 1024 18     Temp 06/20/20 1024 98.1 F (36.7 C)     Temp Source 06/20/20 1024 Oral     SpO2 06/20/20 1024 97 %     Weight 06/20/20 1025 200 lb (90.7 kg)     Height 06/20/20 1025 5\' 3"  (1.6 m)     Head Circumference --      Peak Flow --      Pain Score 06/20/20 1025 0     Pain Loc --      Pain Edu? --      Excl. in [place]? --    No data found.  Updated Vital Signs BP 127/81 (BP Location: Right Arm)   Pulse 86   Temp 98.1 F (36.7 C) (Oral)   Resp 18   Ht 5\' 3"  (1.6 m)   Wt 90.7 kg   LMP 03/31/2018   SpO2 97%   BMI 35.43 kg/m   Visual Acuity Right Eye Distance:   Left Eye Distance:   Bilateral Distance:    Right Eye Near:   Left Eye Near:    Bilateral Near:     Physical Exam Nursing notes and Vital Signs reviewed. Appearance:  Patient appears stated age, and in no acute distress.    Eyes:  Pupils are equal, round, and reactive to light and accomodation.  Extraocular movement is intact.  Conjunctivae are not inflamed   Pharynx:  Normal; moist mucous membranes  Neck:  Supple.  No adenopathy Lungs:  Clear to  auscultation.  Breath sounds are equal.  Moving air well. Heart:  Regular rate and rhythm without murmurs, rubs, or gallops.  Abdomen:  Nontender without masses or hepatosplenomegaly.  Bowel sounds are present.  No CVA or flank tenderness.  Extremities:  No edema.  Skin:  No rash present.     UC Treatments / Results  Labs (all labs ordered are listed, but only abnormal results are displayed) Labs Reviewed  POCT URINALYSIS DIP (MANUAL ENTRY) - Abnormal; Notable for the following components:      Result Value   Ketones, POC UA trace (5) (*)    Spec Grav, UA >=1.030 (*)    All other components within normal limits  URINE CULTURE    EKG   Radiology No results found.  Procedures Procedures (including critical care time)  Medications Ordered in UC Medications - No data to display  Initial Impression / Assessment and Plan / UC Course  I have reviewed the triage vital signs and the nursing notes.  Pertinent labs & imaging results that were available during my care of the patient were reviewed by me and considered in my medical decision making (see chart for details).    Suspect that patient had a mild brief viral URI last week resulting in increased urine output.  No evidence UTI today.  Will check urine culture.   Final Clinical Impressions(s) / UC Diagnoses   Final diagnoses:  Urinary frequency     Discharge Instructions     Increase fluid intake.  No evidence of urinary infection today.    ED Prescriptions    None        Kandra Nicolas, MD 06/22/20 1129

## 2020-06-21 LAB — URINE CULTURE
MICRO NUMBER:: 11738010
SPECIMEN QUALITY:: ADEQUATE

## 2020-07-05 ENCOUNTER — Ambulatory Visit: Payer: PRIVATE HEALTH INSURANCE | Admitting: Sports Medicine

## 2020-08-30 ENCOUNTER — Other Ambulatory Visit: Payer: Self-pay | Admitting: Sports Medicine

## 2020-08-30 DIAGNOSIS — E6609 Other obesity due to excess calories: Secondary | ICD-10-CM

## 2020-08-30 MED ORDER — METFORMIN HCL ER 500 MG PO TB24: 500.0000 mg | ORAL_TABLET | Freq: Every day | ORAL | 3 refills | Status: DC

## 2020-09-17 ENCOUNTER — Other Ambulatory Visit: Payer: Self-pay | Admitting: Sports Medicine

## 2020-10-14 ENCOUNTER — Other Ambulatory Visit: Payer: Self-pay | Admitting: Sports Medicine

## 2020-10-14 DIAGNOSIS — E038 Other specified hypothyroidism: Secondary | ICD-10-CM

## 2020-10-17 ENCOUNTER — Other Ambulatory Visit: Payer: Self-pay | Admitting: Sports Medicine

## 2020-10-17 DIAGNOSIS — E6609 Other obesity due to excess calories: Secondary | ICD-10-CM

## 2021-04-12 ENCOUNTER — Telehealth: Payer: Self-pay

## 2021-04-12 NOTE — Telephone Encounter (Signed)
Medication: WEGOVY 2.4 MG/0.75ML SOAJ Prior authorization submitted via CoverMyMeds on 04/12/2021 [place] submission pending

## 2021-05-01 ENCOUNTER — Other Ambulatory Visit: Payer: Self-pay | Admitting: Sports Medicine

## 2021-05-09 ENCOUNTER — Ambulatory Visit: Payer: PRIVATE HEALTH INSURANCE | Admitting: Sports Medicine

## 2021-05-15 ENCOUNTER — Other Ambulatory Visit: Payer: Self-pay

## 2021-05-15 ENCOUNTER — Ambulatory Visit: Payer: PRIVATE HEALTH INSURANCE | Admitting: Sports Medicine

## 2021-05-15 VITALS — BP 125/85 | HR 94 | Wt 195.1 lb

## 2021-05-15 DIAGNOSIS — E038 Other specified hypothyroidism: Secondary | ICD-10-CM

## 2021-05-15 DIAGNOSIS — Z1231 Encounter for screening mammogram for malignant neoplasm of breast: Secondary | ICD-10-CM

## 2021-05-15 DIAGNOSIS — D649 Anemia, unspecified: Secondary | ICD-10-CM

## 2021-05-15 DIAGNOSIS — E6609 Other obesity due to excess calories: Secondary | ICD-10-CM

## 2021-05-15 DIAGNOSIS — Z0000 Encounter for general adult medical examination without abnormal findings: Secondary | ICD-10-CM

## 2021-05-15 MED ORDER — BUPROPION HCL ER (XL) 300 MG PO TB24: 300.0000 mg | ORAL_TABLET | Freq: Every day | ORAL | 3 refills | Status: DC

## 2021-05-15 MED ORDER — LEVOTHYROXINE SODIUM 50 MCG PO TABS: 50.0000 ug | ORAL_TABLET | Freq: Every day | ORAL | 3 refills | Status: DC

## 2021-05-15 NOTE — Progress Notes (Signed)
? ? ?  Procedures performed today:   ? ?None. ? ?Independent interpretation of notes and tests performed by another provider:  ? ?None. ? ?Brief History, Exam, Impression, and Recommendations:   ? ?Annual physical exam ?Due for breast, referral to Samuel Bouche, DNP for cervical cancer screening, declines shingles, declines flu. ? ?Obesity ?Over 30 pound weight loss with Wegovy, continue max dose Wegovy, she will work on getting more consistent and not forgetting doses. ? ?Normocytic anemia ?Continue ferrous gluconate, patient does want to try it 2 tabs twice a day, I think this is okay, we will recheck her iron indices today. ?She continues to have symptoms of restless leg syndrome, but declines treatment other than the iron for now. ? ? ? ?___________________________________________ ?Gwen Her. Dianah Field, M.D., ABFM., CAQSM. ?Primary Care and Sports Medicine ?[place] ? ?Adjunct Instructor of Family Medicine  ?[employer] ?

## 2021-05-15 NOTE — Assessment & Plan Note (Signed)
Due for breast, referral to Samuel Bouche, DNP for cervical cancer screening, declines shingles, declines flu. ?

## 2021-05-15 NOTE — Assessment & Plan Note (Signed)
Continue ferrous gluconate, patient does want to try it 2 tabs twice a day, I think this is okay, we will recheck her iron indices today. ?She continues to have symptoms of restless leg syndrome, but declines treatment other than the iron for now. ?

## 2021-05-15 NOTE — Assessment & Plan Note (Signed)
Over 30 pound weight loss with Wegovy, continue max dose Wegovy, she will work on getting more consistent and not forgetting doses. ?

## 2021-05-16 LAB — B12 AND FOLATE PANEL
Folate: 11 ng/mL
Vitamin B-12: 1638 pg/mL — ABNORMAL HIGH (ref 200–1100)

## 2021-05-16 LAB — CBC
HCT: 34.3 % — ABNORMAL LOW (ref 35.0–45.0)
Hemoglobin: 11.2 g/dL — ABNORMAL LOW (ref 11.7–15.5)
MCH: 28.7 pg (ref 27.0–33.0)
MCHC: 32.7 g/dL (ref 32.0–36.0)
MCV: 87.9 fL (ref 80.0–100.0)
MPV: 10.6 fL (ref 7.5–12.5)
Platelets: 343 10*3/uL (ref 140–400)
RBC: 3.9 10*6/uL (ref 3.80–5.10)
RDW: 12.8 % (ref 11.0–15.0)
WBC: 10.8 10*3/uL (ref 3.8–10.8)

## 2021-05-16 LAB — LIPID PANEL
Cholesterol: 217 mg/dL — ABNORMAL HIGH (ref <200)
HDL: 58 mg/dL (ref >=50)
LDL Cholesterol (Calc): 134 mg/dL (calc) — ABNORMAL HIGH
Non-HDL Cholesterol (Calc): 159 mg/dL (calc) — ABNORMAL HIGH (ref <130)
Total CHOL/HDL Ratio: 3.7 (calc) (ref <5.0)
Triglycerides: 137 mg/dL (ref <150)

## 2021-05-16 LAB — HEMOGLOBIN A1C
Hgb A1c MFr Bld: 5.5 % of total Hgb (ref <5.7)
Mean Plasma Glucose: 111 mg/dL
eAG (mmol/L): 6.2 mmol/L

## 2021-05-16 LAB — COMPREHENSIVE METABOLIC PANEL
AG Ratio: 1.4 (calc) (ref 1.0–2.5)
ALT: 8 U/L (ref 6–29)
AST: 15 U/L (ref 10–35)
Albumin: 4 g/dL (ref 3.6–5.1)
Alkaline phosphatase (APISO): 118 U/L (ref 37–153)
BUN/Creatinine Ratio: 13 (calc) (ref 6–22)
BUN: 14 mg/dL (ref 7–25)
CO2: 27 mmol/L (ref 20–32)
Calcium: 9.7 mg/dL (ref 8.6–10.4)
Chloride: 105 mmol/L (ref 98–110)
Creat: 1.12 mg/dL — ABNORMAL HIGH (ref 0.50–1.03)
Globulin: 2.8 g/dL (calc) (ref 1.9–3.7)
Glucose, Bld: 82 mg/dL (ref 65–139)
Potassium: 4.3 mmol/L (ref 3.5–5.3)
Sodium: 141 mmol/L (ref 135–146)
Total Bilirubin: 0.3 mg/dL (ref 0.2–1.2)
Total Protein: 6.8 g/dL (ref 6.1–8.1)

## 2021-05-16 LAB — IRON,TIBC AND FERRITIN PANEL
%SAT: 14 % (calc) — ABNORMAL LOW (ref 16–45)
Ferritin: 27 ng/mL (ref 16–232)
Iron: 48 ug/dL (ref 45–160)
TIBC: 344 mcg/dL (calc) (ref 250–450)

## 2021-05-16 LAB — TSH: TSH: 2.25 mIU/L

## 2021-06-20 ENCOUNTER — Ambulatory Visit: Payer: PRIVATE HEALTH INSURANCE

## 2021-08-01 ENCOUNTER — Ambulatory Visit (INDEPENDENT_AMBULATORY_CARE_PROVIDER_SITE_OTHER): Payer: PRIVATE HEALTH INSURANCE

## 2021-08-01 DIAGNOSIS — Z1231 Encounter for screening mammogram for malignant neoplasm of breast: Secondary | ICD-10-CM | POA: Diagnosis not present

## 2021-08-19 ENCOUNTER — Telehealth: Payer: Self-pay | Admitting: General Practice

## 2021-08-19 NOTE — Telephone Encounter (Signed)
Transition Care Management Unsuccessful Follow-up Telephone Call  Date of discharge and from where:  08/15/21 from [facility]  Attempts:  1st Attempt  Reason for unsuccessful TCM follow-up call:  Left voice message

## 2021-08-20 NOTE — Telephone Encounter (Signed)
Transition Care Management Unsuccessful Follow-up Telephone Call  Date of discharge and from where:  08/15/21 from [facility]  Attempts:  2nd Attempt  Reason for unsuccessful TCM follow-up call:  Left voice message

## 2021-08-21 NOTE — Telephone Encounter (Signed)
Transition Care Management Unsuccessful Follow-up Telephone Call  Date of discharge and from where:  08/15/21 from [facility]  Attempts:  3rd Attempt  Reason for unsuccessful TCM follow-up call:  Left voice message

## 2021-10-28 ENCOUNTER — Other Ambulatory Visit: Payer: Self-pay

## 2021-10-28 DIAGNOSIS — E6609 Other obesity due to excess calories: Secondary | ICD-10-CM

## 2021-10-28 MED ORDER — WEGOVY 2.4 MG/0.75ML ~~LOC~~ SOAJ: 2.4000 mg | SUBCUTANEOUS | 11 refills | Status: DC

## 2021-12-13 ENCOUNTER — Telehealth: Payer: Self-pay

## 2021-12-13 NOTE — Telephone Encounter (Signed)
Initiated Prior authorization [id number] 2.'4MG'$ /0.75ML auto-injectors Via: Covermymeds Case/Key:[id number] Status: approved  as of 12/13/21 Reason:approved through 06/13/2022 Notified Pt via: Mychart

## 2022-01-27 IMAGING — CT CT CTA ABD/PEL W/CM AND/OR W/O CM
2 of 8 series · 11 of 46 positions shown, 15 images · IV contrast (iopamidol)
Comparison: CT abdomen and pelvis 06/16/2018

CLINICAL DATA: 53-year-old with fainting and abdominal pain.
Evaluate for mesenteric ischemia. History of partial left
nephrectomy.

EXAM:
CT ANGIOGRAPHY ABDOMEN AND PELVIS WITH CONTRAST AND WITHOUT CONTRAST
TECHNIQUE: Multidetector CT imaging of the abdomen and pelvis was performed
using the standard protocol during bolus administration of
intravenous contrast. Multiplanar reconstructed images and MIPs were
obtained and reviewed to evaluate the vascular anatomy.
CONTRAST:  100mL 8XFVOL-V0W IOPAMIDOL (8XFVOL-V0W) INJECTION 76%

[Series 5: axial venous · axial · portal-venous · 0.97mm/px · z∈[-470,-55]mm · 9 of 103 slices shown, 13 images]
[im 10/103  soft-tissue]
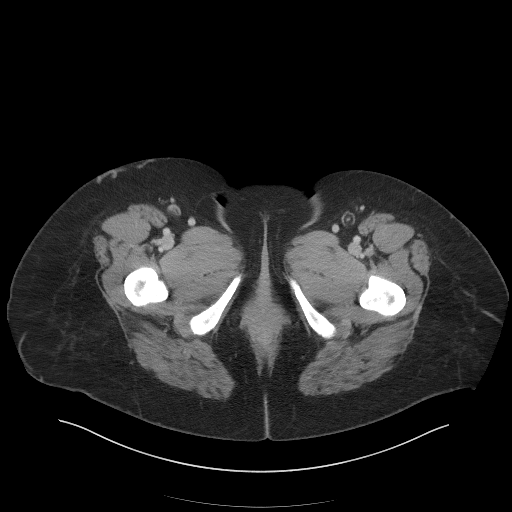
[im 10/103  bone]
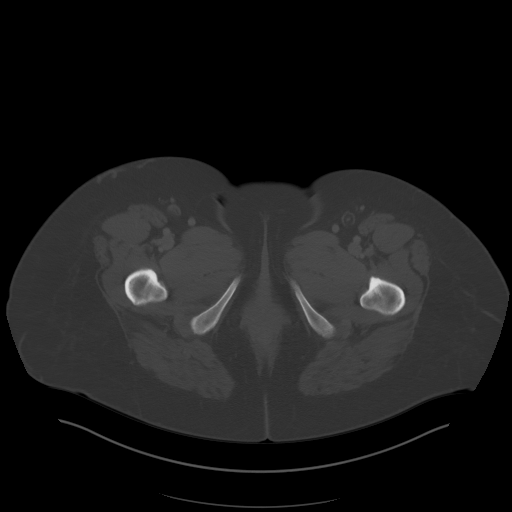
[im 19/103  soft-tissue]
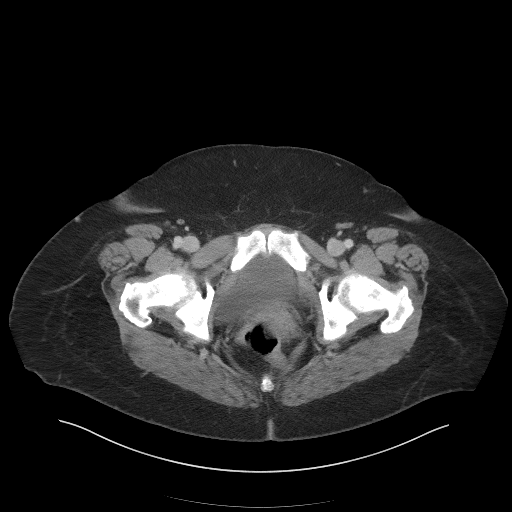
[im 38/103  soft-tissue]
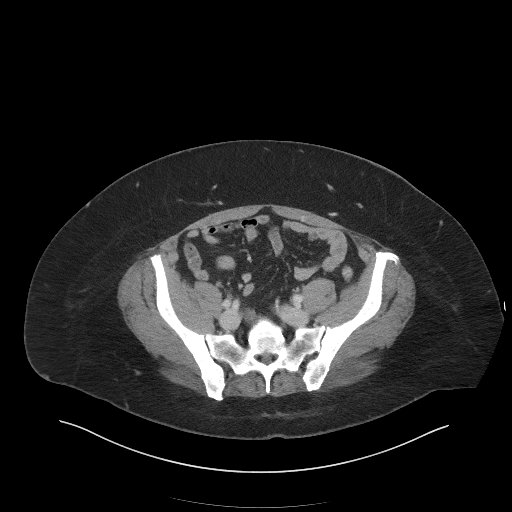
[im 47/103  soft-tissue]
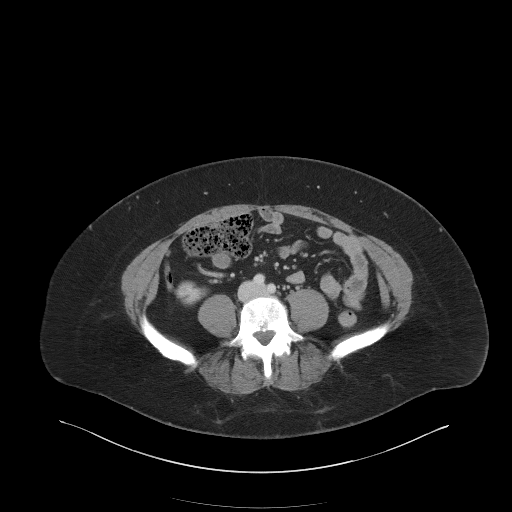
[im 56/103  soft-tissue]
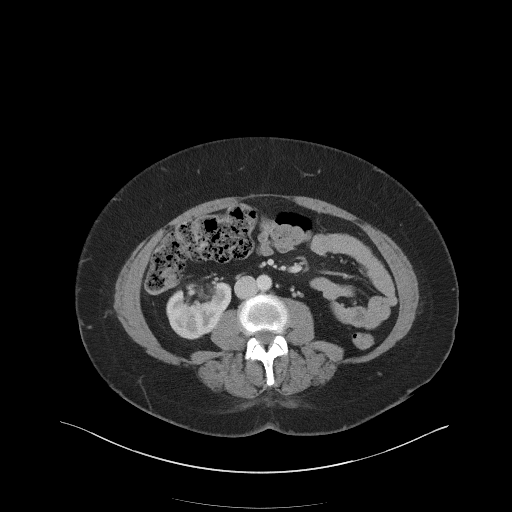
[im 65/103  soft-tissue]
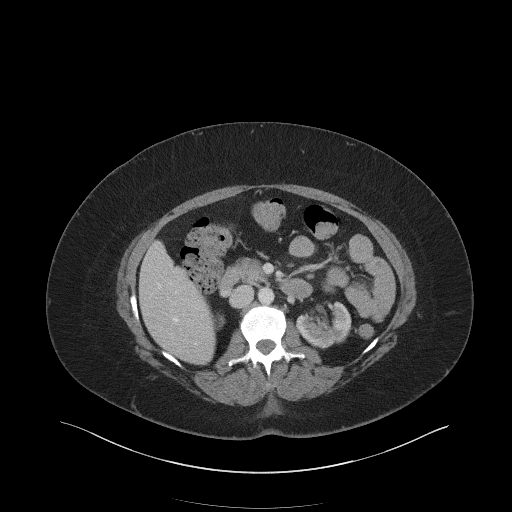
[im 65/103  lung]
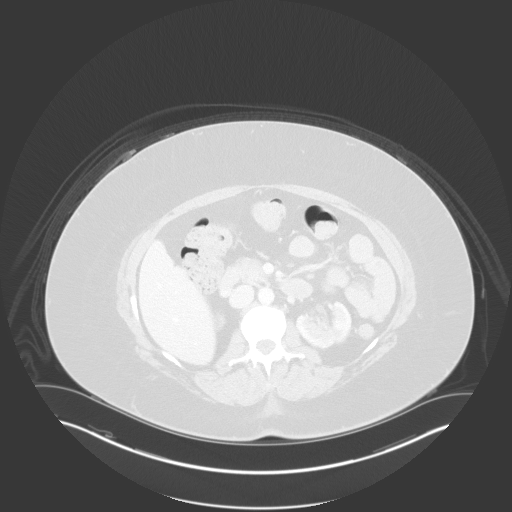
[im 75/103  lung]
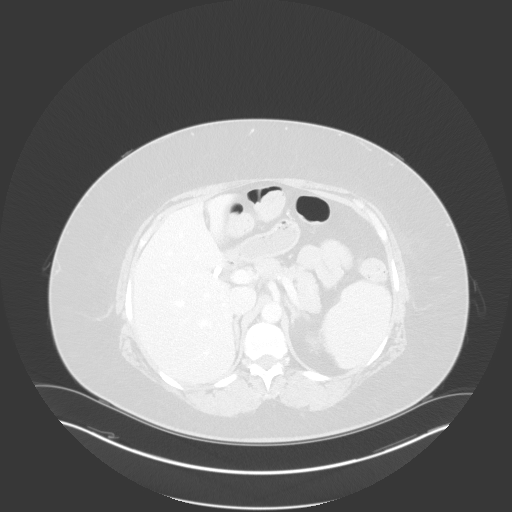
[im 84/103  soft-tissue]
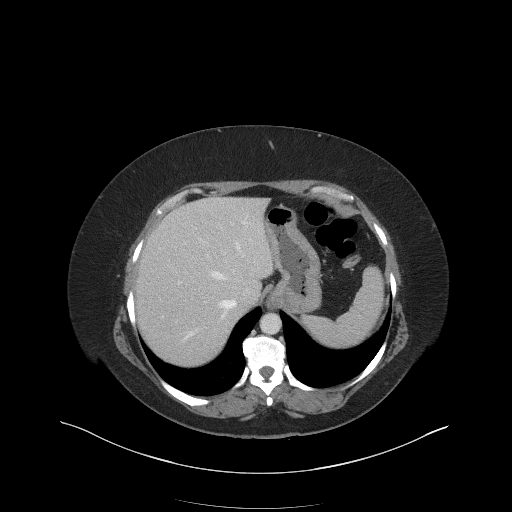
[im 84/103  lung]
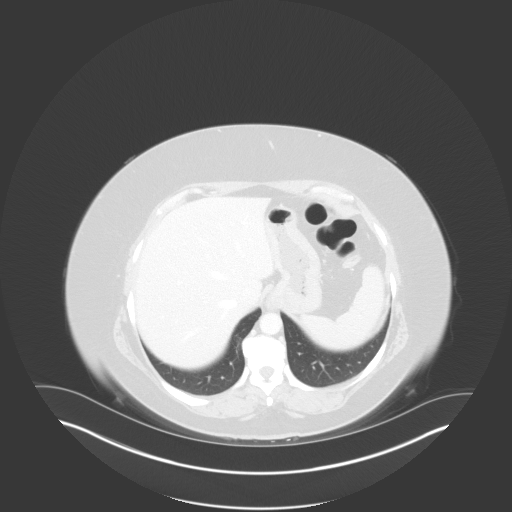
[im 93/103  soft-tissue]
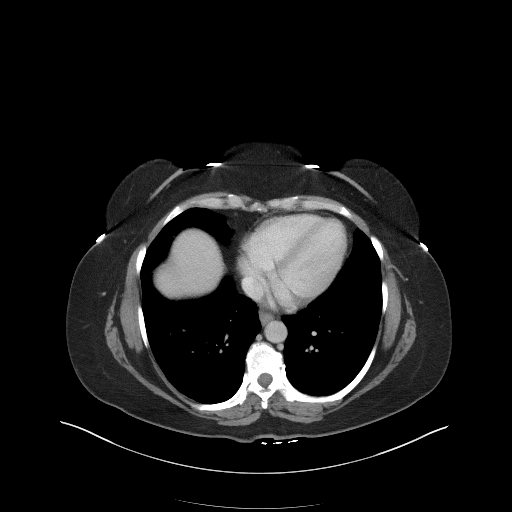
[im 93/103  lung]
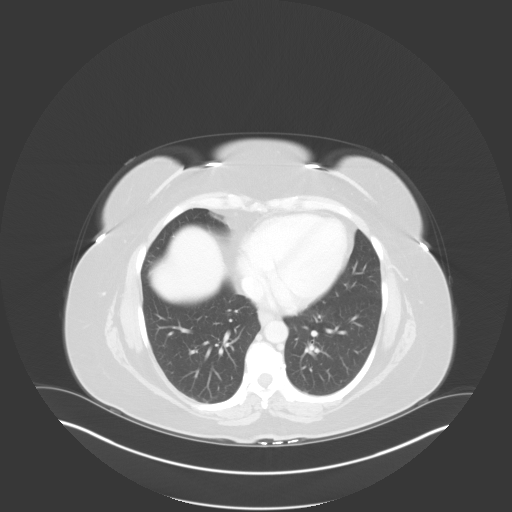

[Series 8: coronals · coronal · 0.85mm/px · 2 of 105 slices shown]
[im 35/105  soft-tissue]
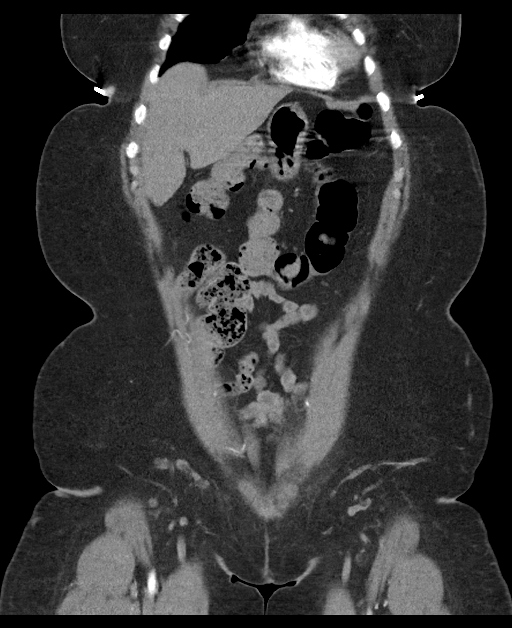
[im 70/105  soft-tissue]
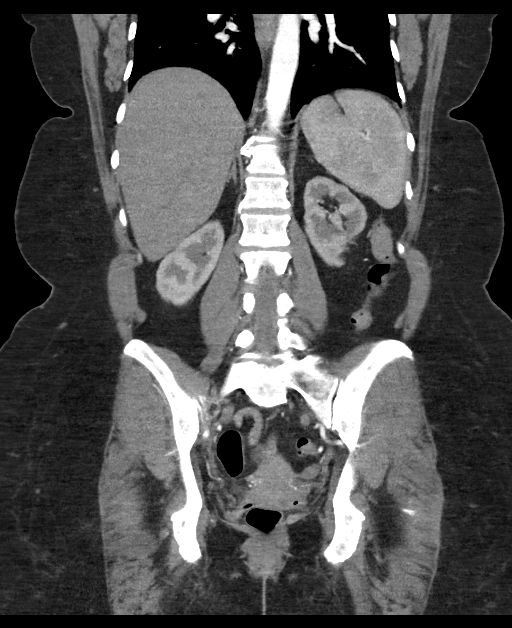

[11 of 46 positions shown; findings below may reference images not displayed]

FINDINGS: VASCULAR

Aorta: Normal caliber aorta without aneurysm, dissection, vasculitis
or significant stenosis.

Celiac: Patent without evidence of aneurysm, dissection, vasculitis
or significant stenosis.

SMA: Patent without evidence of aneurysm, dissection, vasculitis or
significant stenosis.

Renals: 2 right renal arteries and 2 left renal arteries. Inferior
renal arteries originate along the anterior aspect of the aorta
bilaterally and below the takeoff of the IMA. No evidence for
atherosclerotic disease or stenosis involving the renal arteries.

IMA: Patent without evidence of aneurysm, dissection, vasculitis or
significant stenosis.

Inflow: Patent without evidence of aneurysm, dissection, vasculitis
or significant stenosis.

Proximal Outflow: Bilateral common femoral and visualized portions
of the superficial and profunda femoral arteries are patent without
evidence of aneurysm, dissection, vasculitis or significant
stenosis.

Veins: Iliac veins and IVC are patent. No evidence for central
venous compression or occlusion. Portal venous system is patent.
Bilateral renal veins are patent. There are prominent superficial
veins in the proximal right thigh and findings could be associated
with venous insufficiency in the right lower extremity.

Review of the MIP images confirms the above findings.

NON-VASCULAR

Lower chest: Visualized heart is unremarkable without significant
pericardial fluid. Lung bases are clear.

Hepatobiliary: Cholecystectomy. Normal appearance of the liver. No
suspicious liver lesion. No biliary dilatation.

Pancreas: Unremarkable. No pancreatic ductal dilatation or
surrounding inflammatory changes.

Spleen: Normal in size without focal abnormality.

Adrenals/Urinary Tract: Normal appearance of the adrenal glands.
Stable scarring in the left kidney lower pole compatible with
history of partial nephrectomy. Right kidney is slightly malrotated
and unchanged. No suspicious renal lesion. No hydronephrosis. Normal
appearance of the urinary bladder.

Stomach/Bowel: Evidence for a small hiatal hernia. Otherwise, normal
appearance of the stomach and duodenum. No evidence for bowel
dilatation or obstruction. No acute abnormality involving the
appendix.

Lymphatic: No abdominal or pelvic lymphadenopathy.

Reproductive: Uterus and bilateral adnexa are unremarkable. There is
a new low-density structure involving the right ovary that measures
up to 2.2 cm and suspect this represents a physiologic follicle.

Other: No ascites. Negative for free air. Small amount of stranding
along the upper anterior abdominal wall is probably postoperative.
Negative for free air.

Musculoskeletal: No acute bone abnormality. Facet arthropathy in the
lower lumbar spine.
IMPRESSION: VASCULAR

1. Arterial structures in the abdomen and pelvis are widely patent
without atherosclerotic disease or stenosis. Specifically, the
celiac artery, SMA and IMA are widely patent.
2. Bilateral accessory renal arteries.
3. Prominent superficial veins in the anterior right thigh. Findings
could be associated with venous insufficiency in the right lower
extremity. Venous structures in the abdomen and pelvis are within
normal limits.

NON-VASCULAR

1. Stable appearance of the left kidney compatible with partial
nephrectomy. No suspicious renal lesions.
2. No acute abnormality in the abdomen or pelvis.
3. Small hiatal hernia.

## 2022-06-23 ENCOUNTER — Other Ambulatory Visit: Payer: Self-pay | Admitting: Sports Medicine

## 2022-06-23 DIAGNOSIS — E038 Other specified hypothyroidism: Secondary | ICD-10-CM

## 2022-07-29 IMAGING — DX DG CHEST 2V
2 series · 2 of 2 positions shown · non-contrast
Comparison: CT chest report 06/16/2018.  Chest x-ray 01/07/2016.

CLINICAL DATA: Cough.  COVID.

EXAM:
CHEST - 2 VIEW

[chest pa]
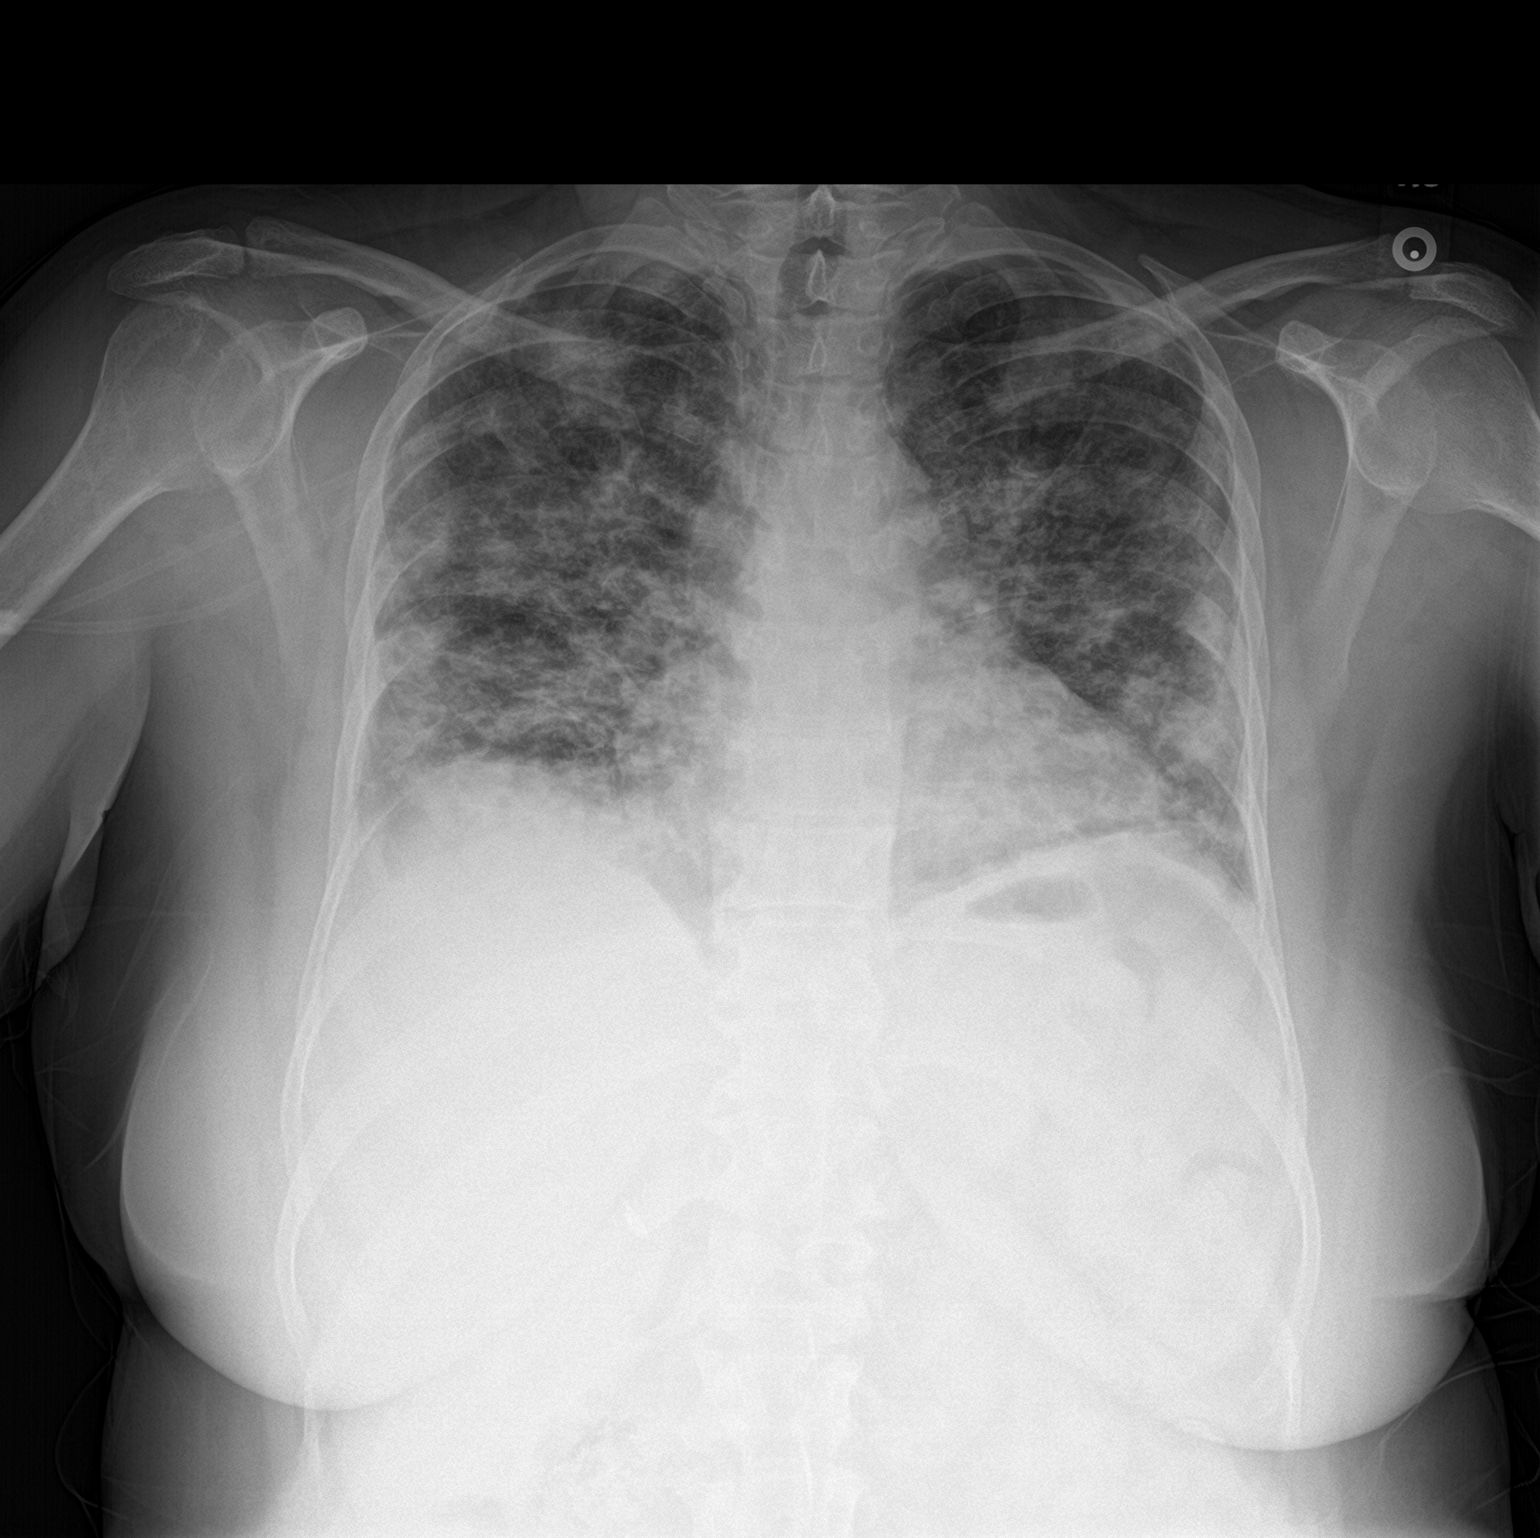

[chest lat]
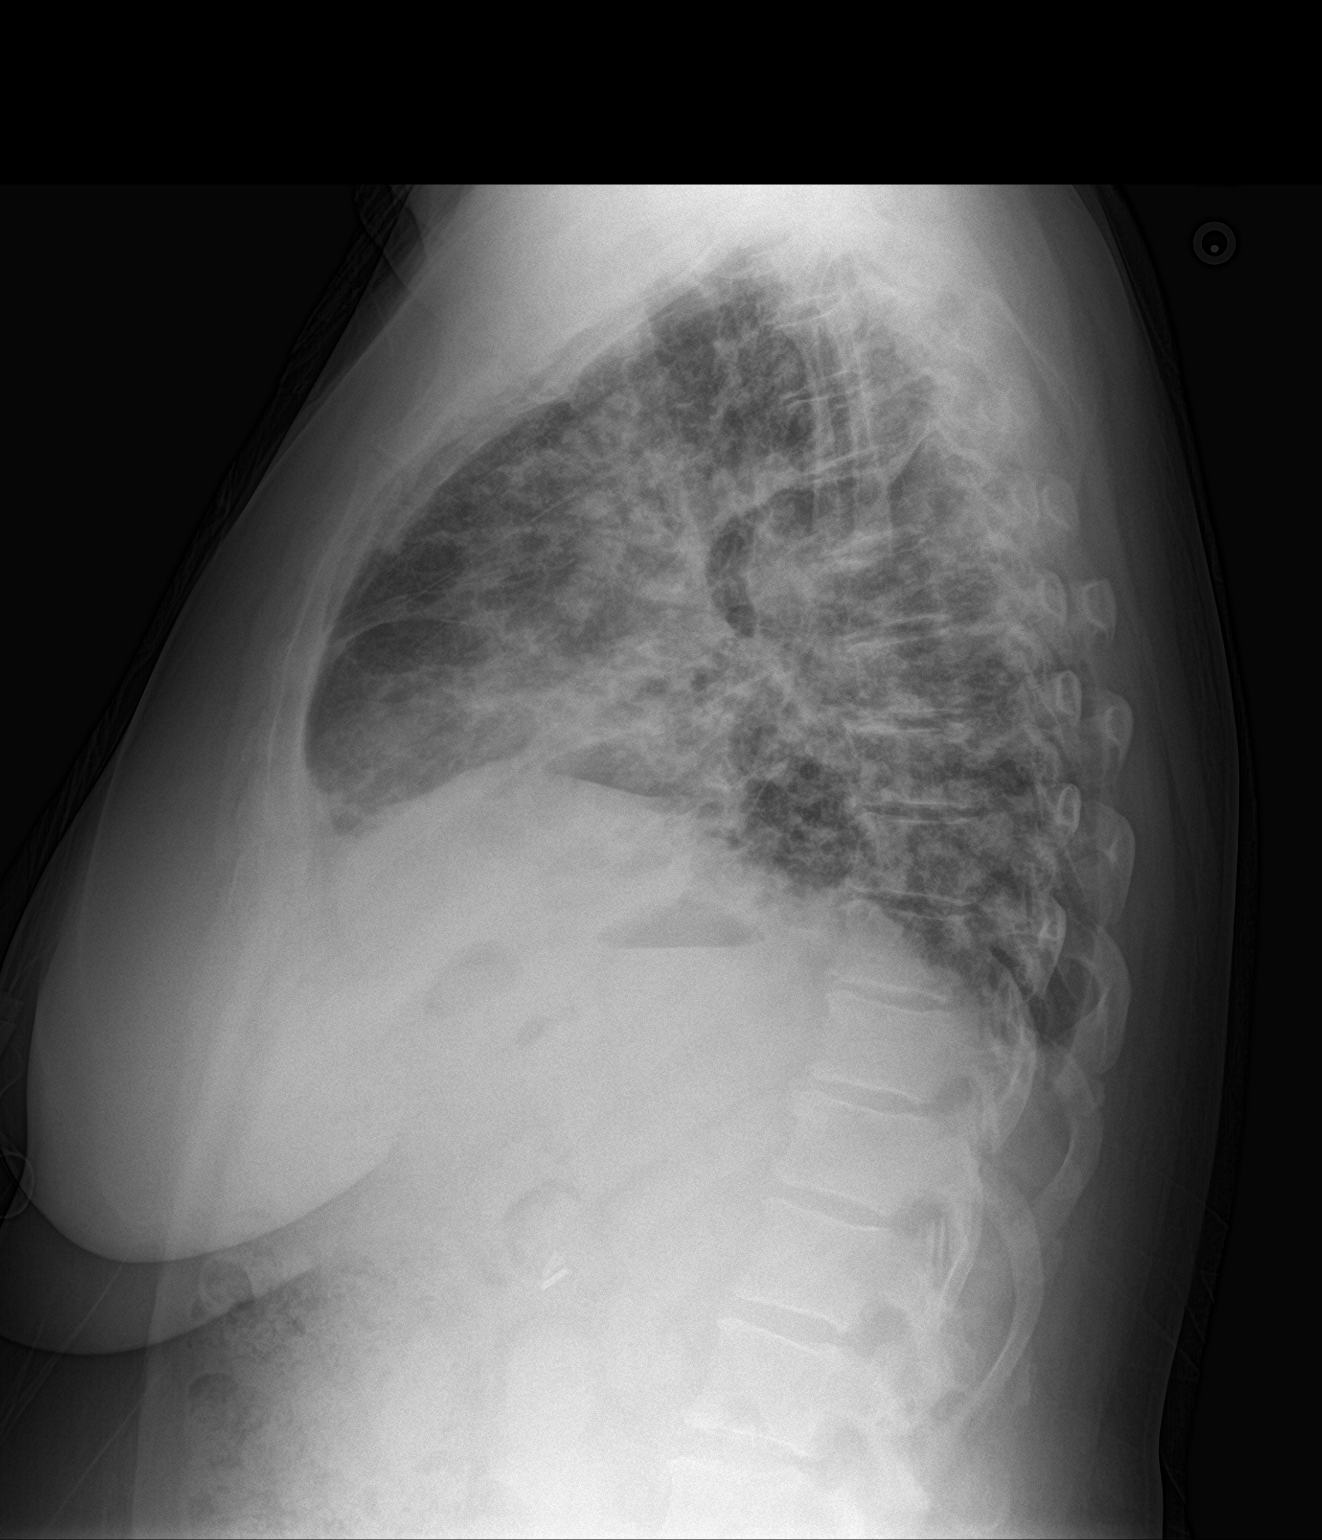

[2 of 2 positions shown; findings below may reference images not displayed]

FINDINGS: Heart size normal. Lung volumes. Severe diffuse bilateral
interstitial infiltrates noted. Findings consistent with
pneumonitis. No pleural effusion or pneumothorax. Degenerative
change thoracic spine. Surgical clips upper abdomen.
IMPRESSION: Low lung volumes. Severe diffuse bilateral interstitial infiltrates
consistent with pneumonitis.

## 2022-08-21 ENCOUNTER — Encounter: Payer: Self-pay | Admitting: Sports Medicine

## 2022-08-21 ENCOUNTER — Ambulatory Visit: Payer: PRIVATE HEALTH INSURANCE | Admitting: Sports Medicine

## 2022-08-21 VITALS — BP 132/83 | HR 94 | Ht 63.0 in | Wt 199.0 lb

## 2022-08-21 DIAGNOSIS — I10 Essential (primary) hypertension: Secondary | ICD-10-CM | POA: Diagnosis not present

## 2022-08-21 DIAGNOSIS — M2550 Pain in unspecified joint: Secondary | ICD-10-CM | POA: Insufficient documentation

## 2022-08-21 DIAGNOSIS — E038 Other specified hypothyroidism: Secondary | ICD-10-CM

## 2022-08-21 DIAGNOSIS — Z0000 Encounter for general adult medical examination without abnormal findings: Secondary | ICD-10-CM

## 2022-08-21 DIAGNOSIS — M79671 Pain in right foot: Secondary | ICD-10-CM | POA: Insufficient documentation

## 2022-08-21 DIAGNOSIS — E6609 Other obesity due to excess calories: Secondary | ICD-10-CM

## 2022-08-21 DIAGNOSIS — D649 Anemia, unspecified: Secondary | ICD-10-CM

## 2022-08-21 MED ORDER — CYCLOBENZAPRINE HCL 10 MG PO TABS
10.0000 mg | ORAL_TABLET | Freq: Every day | ORAL | 3 refills | Status: AC
Start: 2022-08-21 — End: ?

## 2022-08-21 MED ORDER — ZEPBOUND 2.5 MG/0.5ML ~~LOC~~ SOAJ
2.5000 mg | SUBCUTANEOUS | 0 refills | Status: DC
Start: 2022-08-21 — End: 2022-09-17

## 2022-08-21 NOTE — Assessment & Plan Note (Addendum)
Has been off of levothyroxine, we will go and recheck her TSH to see if she still needs to be on it.  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 Recheck TSH and CK in 6 weeks.

## 2022-08-21 NOTE — Assessment & Plan Note (Signed)
Chronic right foot pain localized at the first tarsometatarsal joint, she does have pes cavus, obesity, [name] has requested referral to podiatry so we will set her up with Dr. Ralene Cork downstairs. I think this is more related to degenerative changes.

## 2022-08-21 NOTE — Progress Notes (Addendum)
    Procedures performed today:    None.  Independent interpretation of notes and tests performed by another provider:   None.  Brief History, Exam, Impression, and Recommendations:    Benign essential hypertension Blood pressure sometimes runs low and she feels fairly fatigued and tired, it is elevated at other occasions, I have advised her to check it at home and give me some logs, she needs to wait approximately 15 minutes when relaxed and then check her blood pressure, she will write this down 1-2 times daily and send me a log. No changes in medication at the time being.  Right foot pain Chronic right foot pain localized at the first tarsometatarsal joint, she does have pes cavus, obesity, [name] has requested referral to podiatry so we will set her up with Dr. Ralene Cork downstairs. I think this is more related to degenerative changes.  Polyarthralgia Patient is complaining of widespread aches and pains, usually at night, she tried magnesium which was ineffective. She works fairly hard through the day but does not feel as though this is contributing, she has significant allodynia, hyperalgesia to light palpation. I do suspect were dealing with a fibromyalgia type process here, we will however given the due diligence of a full workup, in the meantime I would like her to do a muscle relaxer at night to help blunt her discomfort and help her sleep.  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  Obesity Has hit a plateau with max dose Wegovy, she is interested in switching to Zepbound, we will try to get her approved. For insurance coverage purposes she will be in a multidisciplinary weight loss program with calorie counting, exercise prescription.  Subclinical hypothyroidism Has been off of levothyroxine, we will go and recheck her TSH to see if she still  needs to be on it.  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 Recheck TSH and CK in 6 weeks.  Annual physical exam Referral to [facility] for cervical cancer screening.  Declines flu, declines shingles vaccinations.  Normocytic anemia Anemic with low iron indices, also low vitamin D, refilling iron and vitamin D.  I spent 30 minutes of total time managing this patient today, this includes chart review, face to face, and non-face to face time.  ____________________________________________ Ihor Austin. Benjamin Stain, M.D., ABFM., CAQSM., AME. Primary Care and Sports Medicine [facility]  Adjunct Professor of Family Medicine  [employer]  Restaurant manager, fast food

## 2022-08-21 NOTE — Assessment & Plan Note (Signed)
Has hit a plateau with max dose Wegovy, she is interested in switching to Zepbound, we will try to get her approved. For insurance coverage purposes she will be in a multidisciplinary weight loss program with calorie counting, exercise prescription.

## 2022-08-21 NOTE — Assessment & Plan Note (Signed)
Blood pressure sometimes runs low and she feels fairly fatigued and tired, it is elevated at other occasions, I have advised her to check it at home and give me some logs, she needs to wait approximately 15 minutes when relaxed and then check her blood pressure, she will write this down 1-2 times daily and send me a log. No changes in medication at the time being.

## 2022-08-21 NOTE — Assessment & Plan Note (Signed)
Referral to [facility] for cervical cancer screening.  Declines flu, declines shingles vaccinations.

## 2022-08-21 NOTE — Assessment & Plan Note (Addendum)
Patient is complaining of widespread aches and pains, usually at night, she tried magnesium which was ineffective. She works fairly hard through the day but does not feel as though this is contributing, she has significant allodynia, hyperalgesia to light palpation. I do suspect were dealing with a fibromyalgia type process here, we will however given the due diligence of a full workup, in the meantime I would like her to do a muscle relaxer at night to help blunt her discomfort and help her sleep.  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

## 2022-08-22 LAB — COMPREHENSIVE METABOLIC PANEL
AG Ratio: 1.4 (calc) (ref 1.0–2.5)
ALT: 10 U/L (ref 6–29)
AST: 12 U/L (ref 10–35)
Albumin: 4.1 g/dL (ref 3.6–5.1)
Alkaline phosphatase (APISO): 119 U/L (ref 37–153)
BUN/Creatinine Ratio: 19 (calc) (ref 6–22)
BUN: 21 mg/dL (ref 7–25)
CO2: 25 mmol/L (ref 20–32)
Calcium: 9.7 mg/dL (ref 8.6–10.4)
Chloride: 105 mmol/L (ref 98–110)
Creat: 1.11 mg/dL — ABNORMAL HIGH (ref 0.50–1.03)
Globulin: 2.9 g/dL (calc) (ref 1.9–3.7)
Glucose, Bld: 85 mg/dL (ref 65–99)
Potassium: 4.4 mmol/L (ref 3.5–5.3)
Sodium: 141 mmol/L (ref 135–146)
Total Bilirubin: 0.5 mg/dL (ref 0.2–1.2)
Total Protein: 7 g/dL (ref 6.1–8.1)

## 2022-08-22 LAB — HEMOGLOBIN A1C
Hgb A1c MFr Bld: 5.5 % of total Hgb (ref <5.7)
Mean Plasma Glucose: 111 mg/dL
eAG (mmol/L): 6.2 mmol/L

## 2022-08-22 LAB — CBC
HCT: 34.6 % — ABNORMAL LOW (ref 35.0–45.0)
Hemoglobin: 11.1 g/dL — ABNORMAL LOW (ref 11.7–15.5)
MCH: 28.8 pg (ref 27.0–33.0)
MCHC: 32.1 g/dL (ref 32.0–36.0)
MCV: 89.9 fL (ref 80.0–100.0)
MPV: 10.4 fL (ref 7.5–12.5)
Platelets: 305 10*3/uL (ref 140–400)
RBC: 3.85 10*6/uL (ref 3.80–5.10)
RDW: 13.1 % (ref 11.0–15.0)
WBC: 11.6 10*3/uL — ABNORMAL HIGH (ref 3.8–10.8)

## 2022-08-22 LAB — IRON,TIBC AND FERRITIN PANEL
%SAT: 14 % (calc) — ABNORMAL LOW (ref 16–45)
Ferritin: 25 ng/mL (ref 16–232)
Iron: 46 ug/dL (ref 45–160)
TIBC: 337 mcg/dL (calc) (ref 250–450)

## 2022-08-22 LAB — LIPID PANEL
Cholesterol: 206 mg/dL — ABNORMAL HIGH (ref <200)
HDL: 66 mg/dL (ref >=50)
LDL Cholesterol (Calc): 119 mg/dL (calc) — ABNORMAL HIGH
Non-HDL Cholesterol (Calc): 140 mg/dL (calc) — ABNORMAL HIGH (ref <130)
Total CHOL/HDL Ratio: 3.1 (calc) (ref <5.0)
Triglycerides: 104 mg/dL (ref <150)

## 2022-08-22 LAB — CK: Total CK: 170 U/L — ABNORMAL HIGH (ref 29–143)

## 2022-08-22 LAB — VITAMIN B12: Vitamin B-12: 697 pg/mL (ref 200–1100)

## 2022-08-22 LAB — TSH: TSH: 4.89 mIU/L — ABNORMAL HIGH

## 2022-08-22 LAB — VITAMIN D 25 HYDROXY (VIT D DEFICIENCY, FRACTURES): Vit D, 25-Hydroxy: 25 ng/mL — ABNORMAL LOW (ref 30–100)

## 2022-08-22 MED ORDER — FERROUS GLUCONATE 324 (38 FE) MG PO TABS
324.0000 mg | ORAL_TABLET | Freq: Three times a day (TID) | ORAL | 11 refills | Status: AC
Start: 2022-08-22 — End: ?

## 2022-08-22 MED ORDER — VITAMIN D (ERGOCALCIFEROL) 1.25 MG (50000 UNIT) PO CAPS
50000.0000 [IU] | ORAL_CAPSULE | ORAL | 0 refills | Status: DC
Start: 2022-08-22 — End: 2023-09-22

## 2022-08-22 MED ORDER — LEVOTHYROXINE SODIUM 50 MCG PO TABS
50.0000 ug | ORAL_TABLET | Freq: Every day | ORAL | 3 refills | Status: DC
Start: 2022-08-22 — End: 2023-09-08

## 2022-08-22 NOTE — Addendum Note (Signed)
Addended by: Monica Becton on: 08/22/2022 12:20 PM   Modules accepted: Orders

## 2022-08-22 NOTE — Assessment & Plan Note (Signed)
Anemic with low iron indices, also low vitamin D, refilling iron and vitamin D.

## 2022-08-25 ENCOUNTER — Encounter: Payer: Self-pay | Admitting: Hematology

## 2022-09-11 ENCOUNTER — Ambulatory Visit: Payer: PRIVATE HEALTH INSURANCE | Admitting: Podiatry

## 2022-09-11 ENCOUNTER — Encounter: Payer: Self-pay | Admitting: Podiatry

## 2022-09-11 ENCOUNTER — Ambulatory Visit: Payer: PRIVATE HEALTH INSURANCE

## 2022-09-11 DIAGNOSIS — M76821 Posterior tibial tendinitis, right leg: Secondary | ICD-10-CM

## 2022-09-11 DIAGNOSIS — G5791 Unspecified mononeuropathy of right lower limb: Secondary | ICD-10-CM

## 2022-09-11 MED ORDER — METHYLPREDNISOLONE 4 MG PO TBPK: ORAL_TABLET | ORAL | 0 refills | Status: DC

## 2022-09-11 NOTE — Progress Notes (Signed)
  Subjective:  Patient ID: [name], female    DOB: 30-Nov-1965,   MRN: [id number]  Chief Complaint  Patient presents with   Foot Pain    Right foot pain , patient states she thinks its a nerve issue states pain is worse at night but she can feel the pain if pressure is applied or if she is sitting down    57 y.o. female presents for concern of right foot pain that has been going on for about a month. Relates pain mostly at night and unable to sleep well as the pain wakes her up. Does relates a possible injury in dansko shoes on the steps that put pressure on the top of her foot. Relates pain on the inside and top of her foot and radiates up her leg and does have some back pain. She takes tylenol and has tried wearing certain boots to help with the pain . Denies any other pedal complaints. Denies n/v/f/c.   Past Medical History:  Diagnosis Date   Abnormal Pap smear of cervix    Anemia    history of iron deficiency-last 01-26-15. History of" chronic elevated white cell count"   Hematuria    "blood in urine"   Hypertension    Pain in hip joint    left hip pain intermittent   PPD positive, treated    Renal cell carcinoma (HCC)    Wound, open    right index finger "injury" -remains -has wrapped with bandage.    Objective:  Physical Exam: Vascular: DP/PT pulses 2/4 bilateral. CFT <3 seconds. Normal hair growth on digits. No edema.  Skin. No lacerations or abrasions bilateral feet.  Musculoskeletal: MMT 5/5 bilateral lower extremities in DF, PF, Inversion and Eversion. Deceased ROM in DF of ankle joint. Exquisitely tender to rigth first tarsometatarsal joint and around insertion of PT tendon. Pain noted distally along the first metatarsal mildly. Pain with inversion and DF of the foot unable to perform single limb heel rise without pain. Negative tinel sign.  Neurological: Sensation intact to light touch.   Assessment:   1. Neuritis of right foot   2. Posterior tibial tendon  dysfunction (PTTD) of right lower extremity      Plan:  Patient was evaluated and treated and all questions answered. X-rays reviewed and discussed with patient. No acute fractures or dislocations. Spurring noted to inferior and posterior calcaneus.  Discussed neruitis vs radiculopathy vs PTTD diagnosis and treatment options with patient. Stretching exercises discussed and handout dispensed. Prescription for medrol dose pack provided.  Patient with history of Kidney cancer and unable to take NSAIDS.  Increased CK relates to aches in the body.  Tri-Lock ankle brace dispensed. Discussed if there is no improvement PT/MRI/injection may be an option. Patient to return to clinic in 6 to 8 weeks or sooner if symptoms fail to improve or worsen.   Louann Sjogren, DPM

## 2022-09-11 NOTE — Patient Instructions (Signed)
Posterior Tibial Tendinitis Rehab Ask your health care provider which exercises are safe for you. Do exercises exactly as told by your provider and adjust them as directed. It's normal to feel mild stretching, pulling, tightness, or discomfort as you do these exercises. Stop right away if you feel sudden pain or your pain gets worse. Do not begin these exercises until told by your provider. Stretching and range-of-motion exercises These exercises warm up your muscles and joints and improve the movement and flexibility in your ankle and foot. These exercises may also help to relieve pain. Standing wall calf stretch, knee straight  Stand with your hands against a wall. Extend your left / right leg behind you, and bend your front knee slightly. If told, place a folded washcloth under the arch of your foot for support. Point the toes of your back foot slightly inward. Keeping your heels on the floor and your back knee straight, shift your weight toward the wall. Do not allow your back to arch. You should feel a gentle stretch in your upper left / right calf. Hold this position for __________ seconds. Repeat __________ times. Complete this exercise __________ times a day. Standing wall calf stretch, knee bent  Stand with your hands against a wall. Extend your left / right leg behind you, and bend your front knee slightly. If told, place a folded washcloth under the arch of your foot for support. Point the toes of your back foot slightly inward. Unlock your back knee so it's bent. Keep your heels on the floor. You should feel a gentle stretch deep in your lower left / right calf. Hold this position for __________ seconds. Repeat __________ times. Complete this exercise __________ times a day. Strengthening exercises These exercises build strength and endurance in your ankle and foot. Endurance is the ability to use your muscles for a long time, even after they get tired. Ankle inversion with  band  Secure one end of a rubber exercise band or tubing to a fixed object, such as a table leg or a pole, that will stay still when the band is pulled. Loop the other end of the band around the middle of your left / right foot. Sit on the floor facing the object with your left / right leg extended. The band or tube should be slightly tense when your foot is relaxed. Leading with your big toe, slowly bring your left / right foot and ankle inward, toward your other foot (inversion). Hold this position for __________ seconds. Slowly return your foot to the starting position. Repeat __________ times. Complete this exercise __________ times a day. Towel curls  Sit in a chair on a non-carpeted surface, and put your feet on the floor. Place a towel in front of your feet. If told by your provider, add a __________ weight to the end of the towel. Keeping your heel on the floor, put your left / right foot on the towel. Pull the towel toward you by grabbing the towel with your toes and curling them under. Keep your heel on the floor while you do this. Let your toes relax. Grab the towel with your toes again. Keep going until the towel is completely underneath your foot. Repeat __________ times. Complete this exercise __________ times a day. Balance exercise This exercise improves or maintains your balance. Balance is important in preventing falls. Single leg stand  Without wearing shoes, stand near a railing or in a doorway. You may hold on to the railing or   doorframe as needed for balance. Stand on your left / right foot. Keep your big toe down on the floor and try to keep your arch lifted. If balancing in this position is too easy, try the exercise with your eyes closed or while standing on a pillow. Hold this position for __________ seconds. Repeat __________ times. Complete this exercise __________ times a day. This information is not intended to replace advice given to you by your health care  provider. Make sure you discuss any questions you have with your health care provider. Document Revised: 03/05/2022 Document Reviewed: 03/05/2022 Elsevier Patient Education  2024 Elsevier Inc.  

## 2022-09-17 ENCOUNTER — Other Ambulatory Visit (HOSPITAL_COMMUNITY)
Admission: RE | Admit: 2022-09-17 | Discharge: 2022-09-17 | Disposition: A | Discharge status: Home / Self Care | Payer: PRIVATE HEALTH INSURANCE | Source: Ambulatory Visit | Attending: Medical-Surgical | Admitting: Medical-Surgical

## 2022-09-17 ENCOUNTER — Ambulatory Visit: Payer: PRIVATE HEALTH INSURANCE | Admitting: Medical-Surgical

## 2022-09-17 ENCOUNTER — Encounter (INDEPENDENT_AMBULATORY_CARE_PROVIDER_SITE_OTHER): Payer: PRIVATE HEALTH INSURANCE | Admitting: Sports Medicine

## 2022-09-17 ENCOUNTER — Encounter: Payer: Self-pay | Admitting: Medical-Surgical

## 2022-09-17 DIAGNOSIS — E6609 Other obesity due to excess calories: Secondary | ICD-10-CM | POA: Diagnosis not present

## 2022-09-17 DIAGNOSIS — Z124 Encounter for screening for malignant neoplasm of cervix: Secondary | ICD-10-CM | POA: Diagnosis not present

## 2022-09-17 DIAGNOSIS — N841 Polyp of cervix uteri: Secondary | ICD-10-CM | POA: Diagnosis not present

## 2022-09-17 MED ORDER — OZEMPIC (0.25 OR 0.5 MG/DOSE) 2 MG/1.5ML ~~LOC~~ SOPN
PEN_INJECTOR | SUBCUTANEOUS | 1 refills | Status: DC
Start: 2022-09-17 — End: 2022-12-10

## 2022-09-17 NOTE — Telephone Encounter (Signed)
I spent 5 total minutes of online digital evaluation and management services in this patient-initiated request for online care. 

## 2022-09-17 NOTE — Progress Notes (Signed)
        Established patient visit  History, exam, impression, and plan:  1. Cervical cancer screening 2. Endocervical polyp Pleasant 57 year old female presenting today for completion of a Pap smear.  Notes her last Pap smear was probably around 18 years ago.  At that time it was normal however she did have cervical cancer in her 78s.  Denies concerns for STIs and has not been sexually active with her husband in approximately 2 years.  Notes that she does have some vaginal dryness but no odor, discharge, unusual bleeding, itching, or dysuria.  See below for physical exam.  Pap smear with HPV cotesting completed today.  If normal, plan to follow-up with repeat in 5 years.  She does have an endocervical polyp that is irregularly shaped and slightly greater than 1 cm in size.  Depending on her Pap smear results, we may need to get her over to OB/GYN for further evaluation and management of this.  Patient aware and will wait for her results to come back to see what recommendations are. - Cytology - PAP  Physical Exam Vitals reviewed. Exam conducted with a chaperone present.  Constitutional:      General: She is not in acute distress.    Appearance: Normal appearance. She is not ill-appearing.  HENT:     Head: Normocephalic and atraumatic.  Cardiovascular:     Rate and Rhythm: Normal rate.  Pulmonary:     Effort: Pulmonary effort is normal. No respiratory distress.  Genitourinary:    General: Normal vulva.     Exam position: Lithotomy position.     Pubic Area: No rash or pubic lice.      Labia:        Right: No rash, tenderness, lesion or injury.        Left: No rash, tenderness, lesion or injury.      Uterus: Normal.      Adnexa: Right adnexa normal and left adnexa normal.     Comments: Endocervical polyp at the cervical os irregular shaped and slightly larger than 1 cm in size.  No friability or bleeding at the site.  Postmenopausal vaginal atrophy noted at the introitus and in the vaginal  canal. Neurological:     Mental Status: She is alert.    Procedures performed this visit: None.  Return if symptoms worsen or fail to improve.  __________________________________ Thayer Ohm, DNP, APRN, FNP-BC Primary Care and Sports Medicine [facility] [place]

## 2022-09-19 LAB — CYTOLOGY - PAP
Comment: NEGATIVE
Diagnosis: NEGATIVE
High risk HPV: NEGATIVE

## 2022-10-02 ENCOUNTER — Ambulatory Visit: Payer: PRIVATE HEALTH INSURANCE | Admitting: Sports Medicine

## 2022-10-23 ENCOUNTER — Ambulatory Visit: Payer: PRIVATE HEALTH INSURANCE | Admitting: Podiatry

## 2022-12-10 ENCOUNTER — Encounter (INDEPENDENT_AMBULATORY_CARE_PROVIDER_SITE_OTHER): Payer: PRIVATE HEALTH INSURANCE | Admitting: Sports Medicine

## 2022-12-10 DIAGNOSIS — E6609 Other obesity due to excess calories: Secondary | ICD-10-CM

## 2022-12-10 MED ORDER — OZEMPIC (2 MG/DOSE) 8 MG/3ML ~~LOC~~ SOPN
2.0000 mg | PEN_INJECTOR | SUBCUTANEOUS | 11 refills | Status: AC
Start: 2022-12-10 — End: ?

## 2022-12-10 MED ORDER — OZEMPIC (1 MG/DOSE) 4 MG/3ML ~~LOC~~ SOPN
1.0000 mg | PEN_INJECTOR | SUBCUTANEOUS | 0 refills | Status: DC
Start: 2022-12-10 — End: 2023-10-22

## 2022-12-10 NOTE — Telephone Encounter (Signed)

## 2023-09-08 ENCOUNTER — Other Ambulatory Visit: Payer: Self-pay

## 2023-09-08 ENCOUNTER — Other Ambulatory Visit: Payer: Self-pay | Admitting: Sports Medicine

## 2023-09-08 DIAGNOSIS — E038 Other specified hypothyroidism: Secondary | ICD-10-CM

## 2023-09-08 MED ORDER — LEVOTHYROXINE SODIUM 50 MCG PO TABS
50.0000 ug | ORAL_TABLET | Freq: Every day | ORAL | 0 refills | Status: DC
Start: 2023-09-08 — End: 2023-09-30

## 2023-09-22 ENCOUNTER — Ambulatory Visit (INDEPENDENT_AMBULATORY_CARE_PROVIDER_SITE_OTHER): Payer: PRIVATE HEALTH INSURANCE | Admitting: Sports Medicine

## 2023-09-22 ENCOUNTER — Encounter: Payer: Self-pay | Admitting: Sports Medicine

## 2023-09-22 VITALS — BP 133/83 | HR 85 | Resp 20 | Ht 63.0 in | Wt 202.0 lb

## 2023-09-22 DIAGNOSIS — D72829 Elevated white blood cell count, unspecified: Secondary | ICD-10-CM

## 2023-09-22 DIAGNOSIS — R3589 Other polyuria: Secondary | ICD-10-CM | POA: Insufficient documentation

## 2023-09-22 DIAGNOSIS — I10 Essential (primary) hypertension: Secondary | ICD-10-CM | POA: Diagnosis not present

## 2023-09-22 DIAGNOSIS — N3281 Overactive bladder: Secondary | ICD-10-CM | POA: Insufficient documentation

## 2023-09-22 DIAGNOSIS — Z0000 Encounter for general adult medical examination without abnormal findings: Secondary | ICD-10-CM

## 2023-09-22 DIAGNOSIS — M51362 Other intervertebral disc degeneration, lumbar region with discogenic back pain and lower extremity pain: Secondary | ICD-10-CM | POA: Diagnosis not present

## 2023-09-22 DIAGNOSIS — Z1231 Encounter for screening mammogram for malignant neoplasm of breast: Secondary | ICD-10-CM

## 2023-09-22 MED ORDER — MAGNESIUM OXIDE 400 MG PO TABS: 800.0000 mg | ORAL_TABLET | Freq: Every day | ORAL | 3 refills | Status: AC

## 2023-09-22 NOTE — Assessment & Plan Note (Addendum)
 Fasting annual physical today. Declines Shingrix and flu vaccinations. Due for mammogram.

## 2023-09-22 NOTE — Assessment & Plan Note (Signed)
 Feels like she has to use the bathroom all the time, no burning. Question overactive bladder versus hyperglycemic osmotic polyuria. We will check A1c, urinalysis, if both negative we will consider treatment with Myrbetriq  for overactive bladder.  Labs unrevealing with regards to bladder symptoms, adding Myrbetriq . For insurance coverage purposes she is not a candidate for anticholinergic medication such as oxybutynin, Vesicare, Detrol.

## 2023-09-22 NOTE — Assessment & Plan Note (Signed)
 Increasing low back pain with discomfort down both legs, cramping at night. Adding magnesium  before bedtime. If not significantly better in several weeks we will consider advanced imaging and further evaluation of her lumbar spine, my suspicion is this is related to lumbar spinal stenosis.

## 2023-09-22 NOTE — Progress Notes (Addendum)
 Subjective:    CC: Annual Physical Exam  HPI:  This patient is here for their annual physical  I reviewed the past medical history, family history, social history, surgical history, and allergies today and no changes were needed.  Please see the problem list section below in epic for further details.  Past Medical History: Past Medical History:  Diagnosis Date   Abnormal Pap smear of cervix    Anemia    history of iron deficiency-last 01-26-15. History of chronic elevated white cell count   Hematuria    blood in urine   Hypertension    Pain in hip joint    left hip pain intermittent   PPD positive, treated    Renal cell carcinoma (HCC)    Wound, open    right index finger injury -remains -has wrapped with bandage.   Past Surgical History: Past Surgical History:  Procedure Laterality Date   CARPAL TUNNEL RELEASE Bilateral    CERVICAL CONIZATION W/BX     cervical cancer   CHOLECYSTECTOMY     laparoscopic   FOOT SURGERY Right    neuroma   HEEL SPUR SURGERY     NASAL SINUS SURGERY     ROBOTIC ASSITED PARTIAL NEPHRECTOMY Left 02/15/2015   Procedure: ROBOTIC ASSITED PARTIAL NEPHRECTOMY;  Surgeon: Gretel Ferrara, MD;  Location: [facility];  Service: Urology;  Laterality: Left;   Social History: Social History   Socioeconomic History   Marital status: Married    Spouse name: Not on file   Number of children: Not on file   Years of education: Not on file   Highest education level: Not on file  Occupational History   Occupation: CNA  Tobacco Use   Smoking status: Never   Smokeless tobacco: Never  Vaping Use   Vaping status: Never Used  Substance and Sexual Activity   Alcohol use: No    Alcohol/week: 0.0 standard drinks of alcohol   Drug use: No   Sexual activity: Yes    Partners: Male    Comment: vasectomy  Other Topics Concern   Not on file  Social History Narrative   Not on file   Social Drivers of Health   Financial Resource Strain: Low Risk   (03/08/2020)   Received from [facility]   Overall Financial Resource Strain (CARDIA)    Difficulty of Paying Living Expenses: Not hard at all  Food Insecurity: Not on file  Transportation Needs: Not on file  Physical Activity: Not on file  Stress: No Stress Concern Present (03/08/2020)   Received from [facility] of Occupational Health - Occupational Stress Questionnaire    Feeling of Stress : Not at all  Social Connections: Unknown (07/26/2021)   Received from [facility]   Social Network    Social Network: Not on file   Family History: Family History  Problem Relation Age of Onset   Heart disease Mother    Heart disease Father    Allergies: Allergies  Allergen Reactions   Codeine Nausea And Vomiting   Topamax  [Topiramate ] Other (See Comments)    Intolerant even after 2 weeks   Ace Inhibitors Cough   Hydromorphone Hcl Nausea And Vomiting   Medications: See med rec.  Review of Systems: No headache, visual changes, nausea, vomiting, diarrhea, constipation, dizziness, abdominal pain, skin rash, fevers, chills, night sweats, swollen lymph nodes, weight loss, chest pain, body aches, joint swelling, muscle aches, shortness of breath, mood changes, visual or auditory hallucinations.  Objective:  General: Well Developed, well nourished, and in no acute distress.  Neuro: Alert and oriented x3, extra-ocular muscles intact, sensation grossly intact. Cranial nerves II through XII are intact, motor, sensory, and coordinative functions are all intact. HEENT: Normocephalic, atraumatic, pupils equal round reactive to light, neck supple, no masses, no lymphadenopathy, thyroid nonpalpable. Oropharynx, nasopharynx, external ear canals are unremarkable. Skin: Warm and dry, no rashes noted.  Cardiac: Regular rate and rhythm, no murmurs rubs or gallops.  Respiratory: Clear to auscultation bilaterally. Not using accessory muscles, speaking in full sentences.   Abdominal: Soft, nontender, nondistended, positive bowel sounds, no masses, no organomegaly.  Musculoskeletal: Shoulder, elbow, wrist, hip, knee, ankle stable, and with full range of motion.  Impression and Recommendations:    The patient was counselled, risk factors were discussed, anticipatory guidance given.  Annual physical exam Fasting annual physical today. Declines Shingrix and flu vaccinations. Due for mammogram.  Polyuria Feels like she has to use the bathroom all the time, no burning. Question overactive bladder versus hyperglycemic osmotic polyuria. We will check A1c, urinalysis, if both negative we will consider treatment with Myrbetriq  for overactive bladder.  Labs unrevealing with regards to bladder symptoms, adding Myrbetriq . For insurance coverage purposes she is not a candidate for anticholinergic medication such as oxybutynin, Vesicare, Detrol.  Lumbar degenerative disc disease Increasing low back pain with discomfort down both legs, cramping at night. Adding magnesium  before bedtime. If not significantly better in several weeks we will consider advanced imaging and further evaluation of her lumbar spine, my suspicion is this is related to lumbar spinal stenosis.  Leukocytosis Steadily increasing WBC count, asymptomatic, I would like hematology consultation.  ____________________________________________ Debby PARAS. Curtis, M.D., ABFM., CAQSM., AME. Primary Care and Sports Medicine [facility]  Adjunct Professor of [facility] Medicine  [employer]  Restaurant Manager, [employer]

## 2023-09-23 ENCOUNTER — Ambulatory Visit: Payer: Self-pay | Admitting: Sports Medicine

## 2023-09-23 DIAGNOSIS — D72829 Elevated white blood cell count, unspecified: Secondary | ICD-10-CM | POA: Insufficient documentation

## 2023-09-23 LAB — COMPREHENSIVE METABOLIC PANEL WITH GFR
ALT: 41 IU/L — ABNORMAL HIGH (ref 0–32)
AST: 25 IU/L (ref 0–40)
Albumin: 4.1 g/dL (ref 3.8–4.9)
Alkaline Phosphatase: 141 IU/L — ABNORMAL HIGH (ref 44–121)
BUN/Creatinine Ratio: 15 (ref 9–23)
BUN: 15 mg/dL (ref 6–24)
Bilirubin Total: 0.3 mg/dL (ref 0.0–1.2)
CO2: 22 mmol/L (ref 20–29)
Calcium: 10 mg/dL (ref 8.7–10.2)
Chloride: 102 mmol/L (ref 96–106)
Creatinine, Ser: 0.97 mg/dL (ref 0.57–1.00)
Globulin, Total: 2.8 g/dL (ref 1.5–4.5)
Glucose: 89 mg/dL (ref 70–99)
Potassium: 4.5 mmol/L (ref 3.5–5.2)
Sodium: 139 mmol/L (ref 134–144)
Total Protein: 6.9 g/dL (ref 6.0–8.5)
eGFR: 69 mL/min/1.73 (ref >59)

## 2023-09-23 LAB — UA/M W/RFLX CULTURE, ROUTINE
Bilirubin, UA: NEGATIVE
Glucose, UA: NEGATIVE
Ketones, UA: NEGATIVE
Leukocytes,UA: NEGATIVE
Nitrite, UA: NEGATIVE
Protein,UA: NEGATIVE
RBC, UA: NEGATIVE
Specific Gravity, UA: 1.006 (ref 1.005–1.030)
Urobilinogen, Ur: 0.2 mg/dL (ref 0.2–1.0)
pH, UA: 6 (ref 5.0–7.5)

## 2023-09-23 LAB — HEMOGLOBIN A1C
Est. average glucose Bld gHb Est-mCnc: 105 mg/dL
Hgb A1c MFr Bld: 5.3 % (ref 4.8–5.6)

## 2023-09-23 LAB — MICROSCOPIC EXAMINATION
Bacteria, UA: NONE SEEN
Casts: NONE SEEN /LPF
Epithelial Cells (non renal): NONE SEEN /HPF (ref 0–10)
RBC, Urine: NONE SEEN /HPF (ref 0–2)
WBC, UA: NONE SEEN /HPF (ref 0–5)

## 2023-09-23 LAB — LIPID PANEL
Chol/HDL Ratio: 4 ratio (ref 0.0–4.4)
Cholesterol, Total: 203 mg/dL — ABNORMAL HIGH (ref 100–199)
HDL: 51 mg/dL (ref >39)
LDL Chol Calc (NIH): 124 mg/dL — ABNORMAL HIGH (ref 0–99)
Triglycerides: 160 mg/dL — ABNORMAL HIGH (ref 0–149)
VLDL Cholesterol Cal: 28 mg/dL (ref 5–40)

## 2023-09-23 LAB — VITAMIN D 25 HYDROXY (VIT D DEFICIENCY, FRACTURES): Vit D, 25-Hydroxy: 28.5 ng/mL — ABNORMAL LOW (ref 30.0–100.0)

## 2023-09-23 LAB — CBC
Hematocrit: 35.9 % (ref 34.0–46.6)
Hemoglobin: 11.3 g/dL (ref 11.1–15.9)
MCH: 29 pg (ref 26.6–33.0)
MCHC: 31.5 g/dL (ref 31.5–35.7)
MCV: 92 fL (ref 79–97)
Platelets: 270 x10E3/uL (ref 150–450)
RBC: 3.89 x10E6/uL (ref 3.77–5.28)
RDW: 13.4 % (ref 11.7–15.4)
WBC: 13.1 x10E3/uL — ABNORMAL HIGH (ref 3.4–10.8)

## 2023-09-23 LAB — MICROALBUMIN / CREATININE URINE RATIO
Creatinine, Urine: 16.8 mg/dL
Microalb/Creat Ratio: <18 mg/g{creat} (ref 0–29)
Microalbumin, Urine: <3 ug/mL

## 2023-09-23 LAB — TSH: TSH: 3.8 u[IU]/mL (ref 0.450–4.500)

## 2023-09-23 MED ORDER — MIRABEGRON ER 50 MG PO TB24: 50.0000 mg | ORAL_TABLET | Freq: Every day | ORAL | 3 refills | Status: DC

## 2023-09-23 MED ORDER — VITAMIN D3 50 MCG (2000 UT) PO CAPS: 2000.0000 [IU] | ORAL_CAPSULE | Freq: Every day | ORAL | 3 refills | Status: AC

## 2023-09-23 NOTE — Addendum Note (Signed)
 Addended by: CURTIS DEBBY PARAS on: 09/23/2023 12:08 PM   Modules accepted: Orders

## 2023-09-23 NOTE — Assessment & Plan Note (Signed)
 Steadily increasing WBC count, asymptomatic, I would like hematology consultation.

## 2023-09-29 ENCOUNTER — Other Ambulatory Visit: Payer: Self-pay | Admitting: Sports Medicine

## 2023-09-29 DIAGNOSIS — E038 Other specified hypothyroidism: Secondary | ICD-10-CM

## 2023-10-05 ENCOUNTER — Encounter: Payer: Self-pay | Admitting: Family

## 2023-10-22 ENCOUNTER — Inpatient Hospital Stay: Payer: PRIVATE HEALTH INSURANCE | Attending: Medical Oncology

## 2023-10-22 ENCOUNTER — Encounter: Payer: Self-pay | Admitting: Medical Oncology

## 2023-10-22 ENCOUNTER — Inpatient Hospital Stay: Payer: PRIVATE HEALTH INSURANCE | Admitting: Medical Oncology

## 2023-10-22 ENCOUNTER — Ambulatory Visit: Payer: Self-pay | Admitting: Medical Oncology

## 2023-10-22 VITALS — BP 132/54 | HR 72 | Temp 97.8°F | Resp 18 | Ht 63.0 in | Wt 208.1 lb

## 2023-10-22 DIAGNOSIS — Z905 Acquired absence of kidney: Secondary | ICD-10-CM | POA: Insufficient documentation

## 2023-10-22 DIAGNOSIS — D509 Iron deficiency anemia, unspecified: Secondary | ICD-10-CM | POA: Insufficient documentation

## 2023-10-22 DIAGNOSIS — R7982 Elevated C-reactive protein (CRP): Secondary | ICD-10-CM | POA: Diagnosis not present

## 2023-10-22 DIAGNOSIS — Z85528 Personal history of other malignant neoplasm of kidney: Secondary | ICD-10-CM | POA: Diagnosis not present

## 2023-10-22 DIAGNOSIS — D649 Anemia, unspecified: Secondary | ICD-10-CM

## 2023-10-22 DIAGNOSIS — R700 Elevated erythrocyte sedimentation rate: Secondary | ICD-10-CM | POA: Insufficient documentation

## 2023-10-22 DIAGNOSIS — D72829 Elevated white blood cell count, unspecified: Secondary | ICD-10-CM

## 2023-10-22 LAB — CMP (CANCER CENTER ONLY)
ALT: 15 U/L (ref 0–44)
AST: 21 U/L (ref 15–41)
Albumin: 4.2 g/dL (ref 3.5–5.0)
Alkaline Phosphatase: 122 U/L (ref 38–126)
Anion gap: 11 (ref 5–15)
BUN: 16 mg/dL (ref 6–20)
CO2: 25 mmol/L (ref 22–32)
Calcium: 10 mg/dL (ref 8.9–10.3)
Chloride: 106 mmol/L (ref 98–111)
Creatinine: 0.94 mg/dL (ref 0.44–1.00)
GFR, Estimated: >60 mL/min (ref >60)
Glucose, Bld: 95 mg/dL (ref 70–99)
Potassium: 4.6 mmol/L (ref 3.5–5.1)
Sodium: 143 mmol/L (ref 135–145)
Total Bilirubin: 0.5 mg/dL (ref 0.0–1.2)
Total Protein: 7.5 g/dL (ref 6.5–8.1)

## 2023-10-22 LAB — CBC WITH DIFFERENTIAL (CANCER CENTER ONLY)
Abs Immature Granulocytes: 0.06 K/uL (ref 0.00–0.07)
Basophils Absolute: 0.1 K/uL (ref 0.0–0.1)
Basophils Relative: 1 %
Eosinophils Absolute: 0.5 K/uL (ref 0.0–0.5)
Eosinophils Relative: 5 %
HCT: 34.9 % — ABNORMAL LOW (ref 36.0–46.0)
Hemoglobin: 11 g/dL — ABNORMAL LOW (ref 12.0–15.0)
Immature Granulocytes: 1 %
Lymphocytes Relative: 28 %
Lymphs Abs: 2.8 K/uL (ref 0.7–4.0)
MCH: 28.8 pg (ref 26.0–34.0)
MCHC: 31.5 g/dL (ref 30.0–36.0)
MCV: 91.4 fL (ref 80.0–100.0)
Monocytes Absolute: 0.6 K/uL (ref 0.1–1.0)
Monocytes Relative: 6 %
Neutro Abs: 5.8 K/uL (ref 1.7–7.7)
Neutrophils Relative %: 59 %
Platelet Count: 336 K/uL (ref 150–400)
RBC: 3.82 MIL/uL — ABNORMAL LOW (ref 3.87–5.11)
RDW: 14.3 % (ref 11.5–15.5)
Smear Review: NORMAL
WBC Count: 9.8 K/uL (ref 4.0–10.5)
nRBC: 0 % (ref 0.0–0.2)

## 2023-10-22 LAB — SAVE SMEAR(SSMR), FOR PROVIDER SLIDE REVIEW

## 2023-10-22 LAB — C-REACTIVE PROTEIN: CRP: 1.5 mg/dL — ABNORMAL HIGH (ref <1.0)

## 2023-10-22 LAB — SEDIMENTATION RATE: Sed Rate: 45 mm/h — ABNORMAL HIGH (ref 0–22)

## 2023-10-22 LAB — LACTATE DEHYDROGENASE: LDH: 163 U/L (ref 98–192)

## 2023-10-22 NOTE — Progress Notes (Signed)
 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 Maryelizabeth - [place]   HISTORY OF PRESENTING ILLNESS:  [name] 58 y.o. female is referred to our office by their PCP Dr. Debby Curtis for Leukocytosis.   [name] changes appear to be chronic in nature but recently elevated.   Signs of infection: No Recent illness: No Sick contacts: No Recent travel: [place] Fever: No Night Sweats: No Weight loss: No Obesity: Yes Bleeding/bruising: No Fatigue: A bit worse due to life stress and travel Inflammatory conditions: Unsure if she has ulcerative colitis- takes probiotic for this. Flares every few months. Not followed by GI- she prefers [facility] PCP. She is UTD on colonoscopies with Nina last completed 09/09/19 and next due 09/08/29 per PCP notes Malignancies: History of Renal Cell Carcinoma- Diagnosed in 2016. Treated by [facility] by Dr. Zhao. She has been released from Dr. Zhao and is now followed by Dr. Curtis Lynn Cell Disease: No Smoking: No Vigorous exercise: No Asplenia or spleen injury: No Pregnancy: No Thyroid disease: Yes- hypothyroidism- on Synthroid  50 mcg once daily Family History Leukocytosis: Sister has similar treatments Occupational hazards to benzene, pesticides, industrial chemicals: No History of chemotherapy/radiation: No Pruritus: No Cough: No Rash: No SOB/Chest pain/Dyspnea/Peripheral edema: (eosinophilic myocarditis):No Steroid Use/ Bone marrow stimulators/ Catecholamines, Lithium, Plerixafor: No Extreme Physical or emotional Stress: Currently yes No known history of anemia- Yes- Iron deficiency-  Takes oral iron. Has had Iron infusions previously No lumps or bumps that she has noticed.   Wt Readings from Last 3 Encounters:  10/22/23 208 lb 1.9 oz (94.4 kg)  09/22/23 202 lb (91.6 kg)  08/21/22 199 lb (90.3 kg)    MEDICAL HISTORY:  Past Medical History:  Diagnosis Date   Abnormal Pap smear of cervix    Anemia    history of iron deficiency-last 01-26-15. History of chronic elevated white cell count   Hematuria    blood in urine   Hypertension    Pain in hip joint    left hip pain intermittent   PPD positive, treated    Renal cell carcinoma (HCC)    Wound, open    right index finger injury -remains -has wrapped with bandage.    SURGICAL HISTORY: Past Surgical History:  Procedure Laterality Date   CARPAL TUNNEL RELEASE Bilateral    CERVICAL CONIZATION W/BX     cervical cancer   CHOLECYSTECTOMY     laparoscopic   FOOT SURGERY Right    neuroma   HEEL SPUR SURGERY     NASAL SINUS SURGERY     ROBOTIC ASSITED PARTIAL NEPHRECTOMY Left 02/15/2015   Procedure: ROBOTIC ASSITED PARTIAL NEPHRECTOMY;  Surgeon: Gretel Ferrara, MD;  Location: [facility];  Service: Urology;  Laterality: Left;    SOCIAL HISTORY: Social History   Socioeconomic History   Marital status: Married    Spouse name: Not on file   Number of children: Not on file   Years of education: Not on file   Highest education level: Not on file  Occupational History   Occupation: CNA  Tobacco Use   Smoking status: Never   Smokeless tobacco: Never  Vaping Use   Vaping status: Never Used  Substance and Sexual Activity   Alcohol use: No    Alcohol/week: 0.0 standard drinks of alcohol   Drug use: No   Sexual activity: Yes    Partners: Male    Comment: vasectomy  Other Topics Concern   Not on file  Social History Narrative   Not on file   Social Drivers of Health   Financial Resource Strain: Low Risk  (03/08/2020)   Received from [facility]   Overall Financial Resource Strain (CARDIA)     Difficulty of Paying Living Expenses: Not hard at all  Food Insecurity: Not on file  Transportation Needs: Not on file  Physical Activity: Not on file  Stress: No Stress Concern Present (03/08/2020)   Received from [facility] of Occupational Health - Occupational Stress Questionnaire    Feeling of Stress : Not at all  Social Connections: Unknown (07/26/2021)   Received from [facility]   Social Network    Social Network: Not on file  Intimate Partner Violence: Low Risk  (04/15/2022)   Received from [facility] visits prior to 05/17/2022.   Safety    How often does anyone, including family and friends, physically hurt you?: Never    How often does anyone, including family and friends, insult or talk down to you?: Never    How often does anyone, including family and friends, threaten you with harm?: Never    How often does anyone, including family and friends, scream or curse at you?: Never    FAMILY HISTORY: Family History  Problem Relation Age of Onset   Heart disease Mother    Heart disease Father     ALLERGIES:  is allergic to codeine, topamax  [topiramate ], ace inhibitors, and hydromorphone hcl.  MEDICATIONS:  Current Outpatient Medications  Medication Sig Dispense Refill   Cholecalciferol (VITAMIN D3) 50 MCG (2000 UT) capsule Take 1 capsule (2,000 Units total) by mouth daily. 90 capsule 3   cyclobenzaprine  (FLEXERIL ) 10 MG tablet Take 1 tablet (10 mg total) by mouth at bedtime. One half to one tab PO qHS, then increase gradually to one tab TID. 30 tablet 3   ferrous gluconate  (FERGON) 324 MG tablet Take 1 tablet (324 mg total) by mouth 3 (three) times daily with meals. 90 tablet 11   levothyroxine  (SYNTHROID ) 50 MCG tablet Take 1 tablet (50 mcg total) by mouth daily. 90 tablet 3   magnesium  oxide (MAG-OX) 400 MG tablet Take 2 tablets (800 mg total) by mouth at bedtime. 180 tablet 3   mirabegron  ER (MYRBETRIQ ) 50 MG TB24 tablet Take 1  tablet (50 mg total) by mouth daily. 30 tablet 3   Semaglutide , 2 MG/DOSE, (OZEMPIC , 2 MG/DOSE,) 8 MG/3ML SOPN Inject 2 mg into the skin once a week. 3 mL 11   No current facility-administered medications for this visit.    REVIEW OF SYSTEMS:   Constitutional: ( - ) fevers, ( - )  chills , ( - ) night sweats Eyes: ( - ) blurriness of vision, ( - ) double vision, ( - ) watery eyes Ears, nose, mouth, throat, and face: ( - ) mucositis, ( - ) sore throat Respiratory: ( - ) cough, ( - ) dyspnea, ( - ) wheezes Cardiovascular: ( - ) palpitation, ( - ) chest discomfort, ( - ) lower extremity swelling Gastrointestinal:  ( - ) nausea, ( - ) heartburn, ( - ) change in bowel habits Skin: ( - ) abnormal skin rashes Lymphatics: ( - )  new lymphadenopathy, ( - ) easy bruising Neurological: ( - ) numbness, ( - ) tingling, ( - ) new weaknesses Behavioral/Psych: ( - ) mood change, ( - ) new changes  All other systems were reviewed with the patient and are negative.  PHYSICAL EXAMINATION: ECOG PERFORMANCE STATUS: 1 - Symptomatic but completely ambulatory  There were no vitals filed for this visit. There were no vitals filed for this visit.  GENERAL: well appearing female in NAD  SKIN: skin color, texture, turgor are normal, no rashes or significant lesions EYES: conjunctiva are pink and non-injected, sclera clear OROPHARYNX: no exudate, no erythema; lips, buccal mucosa, and tongue normal  NECK: supple, non-tender LYMPH:  no palpable lymphadenopathy in the cervical, axillary or supraclavicular lymph nodes.  LUNGS: clear to auscultation and percussion with normal breathing effort HEART: regular rate & rhythm and no murmurs and no lower extremity edema ABDOMEN: soft, non-tender, non-distended, normal bowel sounds Musculoskeletal: no cyanosis of digits and no clubbing  PSYCH: alert & oriented x 3, fluent speech NEURO: no focal motor/sensory deficits  LABORATORY DATA:  Pending   BLOOD FILM: Review  of the peripheral blood smear showed normal appearing white cells with neutrophils that were appropriately lobated and granulated. There was no predominance of bi-lobed or hyper-segmented neutrophils appreciated. No Dohle bodies were noted. There was no left shifting, immature forms or blasts noted. Lymphocytes remain normal in size without any predominance of large granular lymphocytes. Red cells show no anisopoikilocytosis, macrocytes , microcytes or polychromasia. There were no schistocytes, target cells, echinocytes, acanthocytes, dacrocytes, or stomatocytes.There was no rouleaux formation, nucleated red cells, or intra-cellular inclusions noted. The platelets are normal in size, shape, and color without any clumping evident.  ASSESSMENT & PLAN [name] is a 58 y.o. caucasian female who was referred to us  for Leukocytosis.   Nina most recent CBC showed a Villegas of 13.1 on 09/22/2023 (no differential performed)  We discussed that leukocytosis is an elevation of the Villegas of the body. This condition is fairly common and there are many various potential causes. Often times leukocytosis resolves on its own without any intervention.   There are different types of WBCs and elevations in each can help determine its potential cause. For example, eosinophilia can indicate an allergic response or response to parasitic infections/chronic disease. Lymphocytosis can be from viral syndromes or autoimmune causes. Elevated neutrophil count is the most common form of leukocytosis. About 2.5% of the population naturally falls outside of the standard range. Significant elevations, 2 times above the standard deviation often indicate a bacterial infection. Acute and chronic inflammation due to conditions like rheumatic diseases, Kawasaki disease, adult-onset Still disease, inflammatory bowel disease, and chronic hepatitis are common causes. Additionally, myeloproliferative neoplasms, asplenia, cigarette smoking, stress,  pregnancy, obesity, thyroid disorders, Down syndrome, nonhematologic malignancies, and medications like glucocorticoids, catecholamines, and lithium are potential causes.   A leukemoid reaction is a transient increase in Villegas count marked by a neutrophil count of >50,000 cells/L without a myeloproliferative neoplasm. Medications, asplenia, nonhematologic malignancies, and infection with Costridioides difficile, tuberculosis, pertussis, and visceral larva migrans can cause a leukemoid reaction. This acute inflammatory reaction can be mistaken for leukemia, but careful history, physical examination, and further laboratory evaluation can confirm the diagnosis. Peripheral smears and radiological imaging may be necessary to identify the actual cause of these reactive laboratory findings.[3] This diagnosis must be differentiated from leukemia, defined as increases in blast cells, precursor cells to leukocytes, and immature WBCs rather than mature neutrophils seen  in a leukemoid reaction. A leukemoid reaction improves after treating the underlying cause of the neutrophilia, whereas leukemia continues to demonstrate elevated WBCs until the completion of definitive treatment.   Hypersensitivity reactions, leukemia, stress, asplenia, thymoma, and lymphoma may cause lymphocytosis. Infectious causes of lymphocytosis are generally viral, like EBV, cytomegalovirus, influenza, measles, mumps, rubella, adenovirus, and Coxsackie virus. Bacterial infections like pertussis and cat scratch disease cause lymphocytosis. Additional possible infectious causes are tuberculosis, brucellosis, babesiosis, and syphilis.   Eosinophils account for approximately 1% to 4% of a person's leukocytes, and an eosinophil count >500 cells/L defines eosinophilia. The list of potential underlying causes of eosinophilia is extensive. Allergic conditions like allergic rhinitis and atopic dermatitis are common causes and are generally associated with  mild eosinophilia. Patients with severe eosinophilia, >=20,000 cells/L, are more likely to have a myeloid neoplasm. Besides these 2 extremes, the level of eosinophilia does not help distinguish the underlying cause.  Infectious causes of eosinophilia include helminths, fungi, protozoa, some bacteria, HIV, human T-cell lymphotropic virus type 1, and scabies.[6][7] Nearly any medication reaction can cause eosinophilia. However, nonsteroidal anti-inflammatory (NSAID) medications, allopurinol, phenytoin, penicillins, cephalosporins, and sulfasalazine are some of the more commonly known medications associated with specific syndromes involving eosinophilia. Additional causes of eosinophilia are rheumatologic diseases like eosinophilic granulomatosis with polyangiitis, adrenal insufficiency, and immunodeficiency syndromes.  Work up often includes labs to address potential causes and monitoring for abrupt significant changes to lab values. For persistent/significant leukocytosis, a bone marrow biopsy is typically recommended.   Leukoerythroblastosis typically suggests a myelophthisic process, where normal marrow space is infiltrated and replaced by nonhematopoietic or abnormal cells. The peripheral smear reveals leukocytosis with immature erythroid, myeloid, and blast cells in the peripheral blood. Other potential causes are cytokine release, such as severe acute respiratory syndrome coronavirus 2 (SARS-CoV-2), direct myelotoxicity, and viral infection  Transient basophilia is a reactive response, especially to an acute viral illness. Persistent basophilia on serial CBCs for longer than 8 weeks suggests an underlying malignancy or myeloproliferative disease.   All questions were answered. The patient knows to call the clinic with any problems, questions or concerns.    I have spent a total of 60 minutes minutes of face-to-face and non-face-to-face time, preparing to see the patient, obtaining and/or reviewing  separately obtained history, performing a medically appropriate examination, counseling and educating the patient, ordering medications/tests/procedures, referring and communicating with other health care professionals, documenting clinical information in the electronic health record, independently interpreting results and communicating results to the patient, and care coordination.   Transient Villegas elevation likely secondary to chronic illness and stress. Differential unremarkable. Will monitor.   RTC 6 months APP, labs (CBC w/, CMP, LDH)  Lauraine Dais PA-C Department of Hematology/Oncology [facility] at [facility]

## 2023-10-23 ENCOUNTER — Ambulatory Visit: Payer: PRIVATE HEALTH INSURANCE | Admitting: Sports Medicine

## 2023-10-23 ENCOUNTER — Encounter: Payer: Self-pay | Admitting: Sports Medicine

## 2023-10-23 ENCOUNTER — Ambulatory Visit: Payer: PRIVATE HEALTH INSURANCE

## 2023-10-23 ENCOUNTER — Telehealth: Payer: Self-pay

## 2023-10-23 VITALS — BP 133/78 | HR 76 | Ht 63.0 in | Wt 207.0 lb

## 2023-10-23 DIAGNOSIS — N3281 Overactive bladder: Secondary | ICD-10-CM | POA: Diagnosis not present

## 2023-10-23 DIAGNOSIS — M51362 Other intervertebral disc degeneration, lumbar region with discogenic back pain and lower extremity pain: Secondary | ICD-10-CM

## 2023-10-23 MED ORDER — PREGABALIN 50 MG PO CAPS: ORAL_CAPSULE | ORAL | 3 refills | Status: DC

## 2023-10-23 NOTE — Telephone Encounter (Signed)
 Pt wants someone to call her about her labs as some of her results were abnormal.   Spoke with pt and informed her that Lauraine was out of the office until Monday, 10/26/23. Advised her Lauraine would review the results and respond accordingly. Pt stated understanding.

## 2023-10-23 NOTE — Assessment & Plan Note (Signed)
 Multiple episodes of polyuria, we ruled out urinary tract infection, osmotic hyperglycemic polyuria, we started Myrbetriq , symptoms resolved.

## 2023-10-23 NOTE — Progress Notes (Signed)
    Procedures performed today:    None.  Independent interpretation of notes and tests performed by another provider:   None.  Brief History, Exam, Impression, and Recommendations:    Lumbar degenerative disc disease Known lumbar DDD, cramping in both legs particular at night, we added magnesium  oxide, this has helped her stooling but not her cramping. Adding Lyrica  with an up titration, x-rays of the lumbar spine, MRI, additional home conditioning. For insurance coverage purposes she has failed greater than 6 weeks of physician directed conditioning.  This is for surgical/injection planning.  Overactive bladder Multiple episodes of polyuria, we ruled out urinary tract infection, osmotic hyperglycemic polyuria, we started Myrbetriq , symptoms resolved.    ____________________________________________ Debby PARAS. Curtis, M.D., ABFM., CAQSM., AME. Primary Care and Sports Medicine [facility]  Adjunct Professor of [facility] [employer] Medicine  [employer]  Restaurant manager, fast food

## 2023-10-23 NOTE — Assessment & Plan Note (Signed)
 Known lumbar DDD, cramping in both legs particular at night, we added magnesium  oxide, this has helped her stooling but not her cramping. Adding Lyrica  with an up titration, x-rays of the lumbar spine, MRI, additional home conditioning. For insurance coverage purposes she has failed greater than 6 weeks of physician directed conditioning.  This is for surgical/injection planning.

## 2023-11-04 ENCOUNTER — Ambulatory Visit (INDEPENDENT_AMBULATORY_CARE_PROVIDER_SITE_OTHER): Payer: PRIVATE HEALTH INSURANCE

## 2023-11-04 ENCOUNTER — Ambulatory Visit: Payer: Self-pay | Admitting: Sports Medicine

## 2023-11-04 DIAGNOSIS — Z1231 Encounter for screening mammogram for malignant neoplasm of breast: Secondary | ICD-10-CM

## 2023-11-15 ENCOUNTER — Other Ambulatory Visit: Payer: PRIVATE HEALTH INSURANCE

## 2023-11-17 ENCOUNTER — Encounter: Payer: Self-pay | Admitting: Sports Medicine

## 2023-11-17 ENCOUNTER — Ambulatory Visit (INDEPENDENT_AMBULATORY_CARE_PROVIDER_SITE_OTHER): Payer: PRIVATE HEALTH INSURANCE

## 2023-11-17 DIAGNOSIS — M51362 Other intervertebral disc degeneration, lumbar region with discogenic back pain and lower extremity pain: Secondary | ICD-10-CM | POA: Diagnosis not present

## 2023-12-04 ENCOUNTER — Ambulatory Visit: Payer: PRIVATE HEALTH INSURANCE | Admitting: Sports Medicine

## 2023-12-04 ENCOUNTER — Ambulatory Visit: Payer: PRIVATE HEALTH INSURANCE

## 2023-12-07 ENCOUNTER — Telehealth: Payer: Self-pay

## 2023-12-07 NOTE — Telephone Encounter (Signed)
 Copied from CRM [id number]. Topic: General - Other >> Dec 03, 2023  2:55 PM Miquel SAILOR wrote: Reason for CRM: Paulle from [employer]/[phone]-Needed medical records due to Dr. Leoma no longer with us . Sent to [phone]

## 2023-12-07 NOTE — Telephone Encounter (Signed)
 Spoke with Paulle with [employer] and gave phone # for medical records with [place] of [phone]. He will reach out to them to request these records.

## 2024-02-17 ENCOUNTER — Telehealth: Payer: Self-pay | Admitting: Urgent Care

## 2024-02-17 DIAGNOSIS — M51362 Other intervertebral disc degeneration, lumbar region with discogenic back pain and lower extremity pain: Secondary | ICD-10-CM

## 2024-02-17 NOTE — Telephone Encounter (Unsigned)
 Copied from CRM [id number]. Topic: Clinical - Medication Refill >> Feb 17, 2024 12:11 PM Vivian Z wrote: Medication: pregabalin  (LYRICA ) 50 MG capsule Patient is requesting a bridge prescription until [facility] appointment scheduled 03/30/24.   Has the patient contacted their pharmacy? Yes (Agent: If no, request that the patient contact the pharmacy for the refill. If patient does not wish to contact the pharmacy document the reason why and proceed with request.) (Agent: If yes, when and what did the pharmacy advise?)  This is the patient's preferred pharmacy:  [facility] - [address], [facility] [place] [address] Phone: [phone] Fax: [phone]  Is this the correct pharmacy for this prescription? Yes If no, delete pharmacy and type the correct one.   Has the prescription been filled recently? No  Is the patient out of the medication? Yes  Has the patient been seen for an appointment in the last year OR does the patient have an upcoming appointment? Yes  Can we respond through MyChart? Yes  Agent: Please be advised that Rx refills may take up to 3 business days. We ask that you follow-up with your pharmacy.

## 2024-02-19 MED ORDER — PREGABALIN 50 MG PO CAPS: ORAL_CAPSULE | ORAL | 3 refills | Status: AC

## 2024-03-03 ENCOUNTER — Ambulatory Visit: Payer: Self-pay

## 2024-03-03 NOTE — Telephone Encounter (Signed)
 FYI Patient shows scheduled 03/04/2024 for hospital f/u with Zada Palin, NP

## 2024-03-03 NOTE — Telephone Encounter (Signed)
 FYI Only or Action Required?: FYI only for provider: appointment scheduled on 03/04/24.  Patient was last seen in primary care on 10/23/2023 by Curtis Debby PARAS, MD.  Called Nurse Triage reporting Hypertension.  Symptoms began several days ago.  Interventions attempted: Prescription medications: norvasc.  Symptoms are: gradually worsening.  Triage Disposition: No disposition on file.  Patient/caregiver understands and will follow disposition?:    Copied from CRM [id number]. Topic: Clinical - Red Word Triage >> Mar 03, 2024  2:31 PM Teressa P wrote: Red Word that prompted transfer to Nurse Triage: Blood pressure 156/108 with Blood pressure meds.  She went to the ER Tuesday night.  They put her on norvasc   Reason for Disposition  Systolic BP >= 160 OR Diastolic >= 100  Answer Assessment - Initial Assessment Questions Pt called in stating she was hospitalized 12/16 with elevated BP after having symptomatic elevated BP over the weekend: h/a, n, dizziness. Pt states that she was prescribed Norvasc 5mg  daily HS and to f/u with PCP. Pt states that since appt she has been taking Norvasc at HS but pressure is up all day. States that last night prior to taking rx, BP was 154/101. This morning BP was 139/83 and pt was feeling good. States she did some things around her house and took a shower then developed a h/a. When she rechecked BP it was 156/108; at that time she contacted clinic. She states she doesn't know if she needs to take Norvasc q12h or if she needs something else. States that she is under a lot of stress with the death of her dad and grandson, one of her children is away at college. States that she also works nights but was told to take off until 12/19 per ED. Pt states she orignially took Valsartan -hydrochlorothiazide  but d/c due to weight loss with ozempic  and rx was bottoming out BP. Pt states with deaths and stress, she d/c ozempic  and gained weight back; she has tried to restart  medication on her own but now is experiencing symptomatic HTN. Pt questioned if d/c lyrica  12/03 could have caused elevated BP d/t untreated neuropathy. Pt states she read s/e of lyrica  was weight gain so she stopped it.  Hospital EKG WNL. Pt was alarmed that at appt 09/22/23 eGFR was 69 and at ED 03/01/24 eGFR was 49. Pt states with lab values and symptoms, she is going to hold Ozempic  dose that is due today.  Discussed with symptomatic HTN (h/a) pt should be seen sooner than scheduled appt in January. Appointment scheduled for evaluation. Patient agrees with plan of care, and will call back if anything changes, or if symptoms worsen.     1. BLOOD PRESSURE: What is your blood pressure? Did you take at least two measurements 5 minutes apart?     Last night BP 154/101 then took norvasc; 139/83 this morning when pt woke up; 156/108 post shower prior to NT contacted   2. ONSET: When did you take your blood pressure?     Takes in am, with symptoms and at bedtime   3. HOW: How did you take your blood pressure? (e.g., automatic home BP monitor, visiting nurse)     Automatic home BP monitor   4. HISTORY: Do you have a history of high blood pressure?     Yes  5. MEDICINES: Are you taking any medicines for blood pressure? Have you missed any doses recently?     Norvasc 5mg ; started per ED 12/16 PM   6.  OTHER SYMPTOMS: Do you have any symptoms? (e.g., blurred vision, chest pain, difficulty breathing, headache, weakness)     H/a, shoulder pain  Protocols used: Blood Pressure - High-A-AH

## 2024-03-04 ENCOUNTER — Telehealth: Payer: Self-pay

## 2024-03-04 ENCOUNTER — Ambulatory Visit: Payer: PRIVATE HEALTH INSURANCE | Admitting: Medical-Surgical

## 2024-03-04 ENCOUNTER — Encounter: Payer: Self-pay | Admitting: Medical-Surgical

## 2024-03-04 VITALS — BP 122/79 | HR 75 | Resp 20 | Ht 63.0 in | Wt 215.1 lb

## 2024-03-04 DIAGNOSIS — Z6838 Body mass index (BMI) 38.0-38.9, adult: Secondary | ICD-10-CM

## 2024-03-04 DIAGNOSIS — E66812 Obesity, class 2: Secondary | ICD-10-CM

## 2024-03-04 DIAGNOSIS — Z09 Encounter for follow-up examination after completed treatment for conditions other than malignant neoplasm: Secondary | ICD-10-CM

## 2024-03-04 DIAGNOSIS — N289 Disorder of kidney and ureter, unspecified: Secondary | ICD-10-CM

## 2024-03-04 DIAGNOSIS — I10 Essential (primary) hypertension: Secondary | ICD-10-CM

## 2024-03-04 MED ORDER — ZEPBOUND 10 MG/0.5ML ~~LOC~~ SOAJ: 10.0000 mg | SUBCUTANEOUS | 0 refills | Status: DC

## 2024-03-04 NOTE — Progress Notes (Unsigned)
" ° °       Established patient visit   History of Present Illness   Discussed the use of AI scribe software for clinical note transcription with the patient, who gave verbal consent to proceed.  History of Present Illness          Physical Exam   Physical Exam Assessment & Plan   Problem List Items Addressed This Visit       Cardiovascular and Mediastinum   Benign essential hypertension - Primary   Other Visit Diagnoses       Hospital discharge follow-up          Assessment and Plan             Follow up   No follow-ups on file. __________________________________ Zada FREDRIK Palin, DNP, APRN, FNP-BC Primary Care and Sports Medicine [facility] [place] "

## 2024-03-04 NOTE — Telephone Encounter (Signed)
 Copied from CRM #[id number]. Topic: Clinical - Prescription Issue >> Mar 04, 2024 10:15 AM Dedra B wrote: Reason for CRM: Karleen from [facility] called regarding pt's switch from Ozempic  to Zepbound . He said that if pt is looking for an equivalent effect then he recommends switching from Ozmepic 2 mg to Zepbound  5 mg. If she's trying to go to the next step, then he recommends Zepbound  7.5 mg. In his professional opinion, he feels that going from Ozempic  2 mg to Zepbound  10 mg will be too harsh with the GI side effects. Pls call Karleen at [phone] to let him know what's decided or if any questions.

## 2024-03-04 NOTE — Telephone Encounter (Signed)
Weston BrassNick (pharmacist) informed

## 2024-03-05 ENCOUNTER — Ambulatory Visit: Payer: Self-pay | Admitting: Medical-Surgical

## 2024-03-05 LAB — BASIC METABOLIC PANEL WITH GFR
BUN/Creatinine Ratio: 11 (ref 9–23)
BUN: 12 mg/dL (ref 6–24)
CO2: 24 mmol/L (ref 20–29)
Calcium: 9.5 mg/dL (ref 8.7–10.2)
Chloride: 105 mmol/L (ref 96–106)
Creatinine, Ser: 1.05 mg/dL — ABNORMAL HIGH (ref 0.57–1.00)
Glucose: 87 mg/dL (ref 70–99)
Potassium: 4.4 mmol/L (ref 3.5–5.2)
Sodium: 140 mmol/L (ref 134–144)
eGFR: 62 mL/min/1.73

## 2024-03-23 ENCOUNTER — Encounter: Payer: Self-pay | Admitting: Hematology

## 2024-03-30 ENCOUNTER — Encounter: Payer: Self-pay | Admitting: Hematology

## 2024-03-30 ENCOUNTER — Ambulatory Visit (INDEPENDENT_AMBULATORY_CARE_PROVIDER_SITE_OTHER): Payer: PRIVATE HEALTH INSURANCE | Admitting: Urgent Care

## 2024-03-30 VITALS — BP 123/81 | HR 78 | Ht 63.0 in | Wt 214.0 lb

## 2024-03-30 DIAGNOSIS — Z6837 Body mass index (BMI) 37.0-37.9, adult: Secondary | ICD-10-CM

## 2024-03-30 DIAGNOSIS — R350 Frequency of micturition: Secondary | ICD-10-CM

## 2024-03-30 DIAGNOSIS — E049 Nontoxic goiter, unspecified: Secondary | ICD-10-CM | POA: Diagnosis not present

## 2024-03-30 DIAGNOSIS — R700 Elevated erythrocyte sedimentation rate: Secondary | ICD-10-CM | POA: Diagnosis not present

## 2024-03-30 DIAGNOSIS — E6609 Other obesity due to excess calories: Secondary | ICD-10-CM | POA: Diagnosis not present

## 2024-03-30 DIAGNOSIS — L989 Disorder of the skin and subcutaneous tissue, unspecified: Secondary | ICD-10-CM | POA: Diagnosis not present

## 2024-03-30 DIAGNOSIS — E66812 Obesity, class 2: Secondary | ICD-10-CM

## 2024-03-30 DIAGNOSIS — E038 Other specified hypothyroidism: Secondary | ICD-10-CM

## 2024-03-30 DIAGNOSIS — I10 Essential (primary) hypertension: Secondary | ICD-10-CM | POA: Diagnosis not present

## 2024-03-30 DIAGNOSIS — G629 Polyneuropathy, unspecified: Secondary | ICD-10-CM

## 2024-03-30 MED ORDER — MIRABEGRON ER 50 MG PO TB24: 50.0000 mg | ORAL_TABLET | Freq: Every day | ORAL | 3 refills | Status: AC

## 2024-03-30 MED ORDER — AMLODIPINE BESYLATE 5 MG PO TABS: 5.0000 mg | ORAL_TABLET | Freq: Every day | ORAL | 1 refills | Status: AC

## 2024-03-30 MED ORDER — CLOBETASOL PROPIONATE 0.05 % EX SOLN: 1.0000 | Freq: Two times a day (BID) | CUTANEOUS | 0 refills | Status: AC

## 2024-03-30 MED ORDER — KETOCONAZOLE 2 % EX SHAM: MEDICATED_SHAMPOO | CUTANEOUS | 11 refills | Status: AC

## 2024-03-30 NOTE — Patient Instructions (Addendum)
 We drew labs today for your neuropathy. I have placed a referral to neurology to discuss an EMG.  I also placed a referral to rheumatology per the hematologists request.  I have refilled your medications.  I also sent topical clobetasol  and ketoconazole  shampoo to the pharmacy for your scalp. For the ketoconazole , lather, leave in place for 5 minutes, then rinse.  I have ordered an ultrasound of your thyroid. Please schedule this [address].  Please schedule a follow up in 6-8 weeks

## 2024-03-30 NOTE — Progress Notes (Signed)
 "  Established Patient Office Visit  Subjective:  Patient ID: [name], female    DOB: 09/19/1965  Age: 59 y.o. MRN: [id number]  Chief Complaint  Patient presents with   Establish Care    Check places on scalp    HPI  Discussed the use of AI scribe software for clinical note transcription with the patient, who Villegas verbal consent to proceed.  History of Present Illness   [name] is a 59 year old female with hypertension and neuropathy who presents for medication management and evaluation of her symptoms.  She is here to establish care and manage her medications, particularly for hypertension and neuropathy. Her blood pressure tends to rise when she is tired after work. She works as a buyer, retail on the night shift, which affects her sleep schedule. She has been on amlodipine  since a recent ER visit for elevated blood pressure, which she measured at 168/131 mmHg at home. She attributes her hypertension to weight gain and is currently taking 10 mg of tirzepatide ; she has not noticed significant changes in her weight yet.  She experiences neuropathy in her feet, characterized by tingling, numbness, and excruciating pain when touched. She takes Lyrica , which she finds helpful for her symptoms, particularly muscle cramps that occur at night. She takes three pills before bed. The neuropathy has been present for quite some time.  Her sleep is affected by her work schedule, and she describes her sleep pattern as irregular due to her night shifts. She typically sleeps from 10 AM to 3:30 or 4 PM, but this varies. She feels rested but notes that it is becoming more difficult as she ages.  She has a history of iron deficiency anemia and takes ferrous gluconate . She also takes levothyroxine  for thyroid management, vitamin D , and magnesium  oxide for muscle cramps. She has stopped taking Myrbetriq  daily after reducing her intake of caffeine-containing beverages, and only takes it  on work days now.  She has experienced significant stress over the past year, including the loss of her father and grandson, and her son moving to college. She attributes some of her weight gain to this stress. She lives with her spouse and has three stepchildren.  She reports a recent development of painful spots on her scalp and wonders if they could be chemical burns from hair dye, although she has used the same product for years. She has not applied any treatment to these lesions.   Pt is concerned about her neck. States she feels like it is getting bigger. Is currently on thyroid replacement therapy.     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 History of chronic elevated white cell count   Arthritis    Hematuria    blood in urine   Hypertension    Pain in hip joint    left hip pain intermittent   PPD positive, treated    Renal cell carcinoma (HCC)    Wound, open    right index finger injury -remains -has wrapped with  bandage.   Past Surgical History:  Procedure Laterality Date   CARPAL TUNNEL RELEASE Bilateral    CERVICAL CONIZATION W/BX     cervical cancer   CHOLECYSTECTOMY     laparoscopic   FOOT SURGERY Right    neuroma   HEEL SPUR SURGERY     NASAL SINUS SURGERY     ROBOTIC ASSITED PARTIAL NEPHRECTOMY Left 02/15/2015   Procedure: ROBOTIC ASSITED PARTIAL NEPHRECTOMY;  Surgeon: Gretel Ferrara, MD;  Location: [facility];  Service: Urology;  Laterality: Left;   Social History[1]    ROS: as noted in HPI  Objective:     BP 123/81   Pulse 78   Ht 5' 3 (1.6 m)   Wt 214 lb (97.1 kg)   LMP 03/31/2018   SpO2 98%   BMI 37.91 kg/m  BP Readings from Last 3 Encounters:  03/30/24 123/81  03/04/24 122/79  10/23/23 133/78   Wt Readings from Last 3 Encounters:  03/30/24 214 lb (97.1 kg)  03/04/24 215 lb 1.9 oz (97.6 kg)  10/23/23 207 lb (93.9 kg)      Physical Exam Vitals and nursing note reviewed. Exam conducted with a chaperone present.  Constitutional:      General: She is not in acute distress.    Appearance: Normal appearance. She is not ill-appearing, toxic-appearing or diaphoretic.  HENT:     Head: Normocephalic and atraumatic.     Right Ear: External ear normal.     Left Ear: External ear normal.     Nose: Nose normal.     Mouth/Throat:     Mouth: Mucous membranes are moist.     Pharynx: Oropharynx is clear. No oropharyngeal exudate or posterior oropharyngeal erythema.  Eyes:     General: No scleral icterus.       Right eye: No discharge.        Left eye: No discharge.     Extraocular Movements: Extraocular movements intact.     Pupils: Pupils are equal, round, and reactive to light.  Cardiovascular:     Rate and Rhythm: Normal rate and regular rhythm.  Pulmonary:     Effort: Pulmonary effort is normal. No respiratory distress.  Musculoskeletal:     Cervical back: Normal range of motion and neck supple. No rigidity.  Lymphadenopathy:     Cervical: No cervical  adenopathy.  Skin:    General: Skin is warm and dry.     Coloration: Skin is not jaundiced.     Findings: Rash (rash to scalp, two spots in occipial region are erythematous annular lesions with scaling borders and central clearing. Other areas to top of scalp appear as small scabs) present. No bruising or erythema.  Neurological:     General: No focal deficit present.     Mental Status: She is alert and oriented to person, place, and time.     Gait: Gait normal.  Psychiatric:        Mood and Affect: Mood normal.        Behavior: Behavior normal.  The 10-year ASCVD risk score (Arnett DK, et al., 2019) is: 5.7%  Assessment & Plan:  Benign essential hypertension -     amLODIPine  Besylate; Take 1 tablet (5 mg total) by mouth daily.  Dispense: 90 tablet; Refill: 1 -     CBC -     Comprehensive metabolic panel with GFR  Urinary frequency -     Mirabegron  ER; Take 1 tablet (50 mg total) by mouth daily.  Dispense: 30 tablet; Refill: 3  ESR raised -     Ambulatory referral to Rheumatology  Subclinical hypothyroidism -     US  THYROID; Future  Neuropathy -     Vitamin B6 -     ANCA screen with reflex titer -     CBC -     Comprehensive metabolic panel with GFR -     Heavy Metals Panel, Blood -     Methylmalonic acid, serum -     TSH -     Vitamin B1 -     Vitamin B12 -     Zinc  -     Hemoglobin A1c -     ANCA [facility] ( [facility])  Goiter -     US  THYROID; Future -     TSH  Scalp lesion -     Ketoconazole ; Apply to scalp twice a week for 8 weeks and then weekly thereafter  Dispense: 120 mL; Refill: 11 -     Clobetasol  Propionate; Apply 1 Application topically 2 (two) times daily.  Dispense: 50 mL; Refill: 0  Assessment and Plan    Scalp dermatophytosis (tinea capitis) Scalp lesions suggest tinea capitis, differential includes chemical irritation or fungal infection. - Prescribed ketoconazole  and clotrimazole shampoo for scalp  application.  Essential hypertension Blood pressure well-controlled on current regimen. Recent elevation likely due to weight gain. - Continue amlodipine  as prescribed.  Obesity Weight stable at 214 lbs. Inconsistent tirzepatide  use due to shift work. Discussed Contrave  for weight management. - Encouraged consistent use of tirzepatide  with a weekly alarm. - Discussed potential addition of Contrave  for weight management.  Polyneuropathy Chronic neuropathy in feet managed with Lyrica . No prior EMG or comprehensive evaluation. - Continue Lyrica  as currently taken. - referral placed to neurology for possible EMG to evaluate neuropathy. - Ordered additional labs to assess for potential causes of neuropathy.  Subclinical hypothyroidism with goiter Reports neck enlargement. Normal thyroid levels in July. Family history of thyroid issues. - Ordered thyroid ultrasound to evaluate goiter. - recheck labs today  Urinary frequency Previously managed with Myrbetriq , discontinued due to perceived ineffectiveness. Symptoms possibly exacerbated by caffeine. - Continue Myrbetriq  as needed for urinary frequency.  Iron deficiency anemia Chronic iron deficiency anemia with low RBC and high WBC. No clear etiology identified. - Continue ferrous gluconate  supplementation.  Elevated inflammatory markers Likely secondary to chronic illness and stress. Rheumatology referral requested by her oncologist. - Ordered rheumatology referral for further evaluation of inflammatory markers per request of hem/onc     I spent 45 minutes of total time managing this patient today, this includes chart review, face to face, and non-face to face time, reviewing outside records and labs and providing personal interpretation.    Return in about 8 weeks (around 05/25/2024).   Nina LITTIE Gave, PA    [1]  Social History Tobacco Use   Smoking status: Never   Smokeless tobacco: Never  Vaping Use   Vaping status: Never  Used  Substance Use Topics   Alcohol use:  No    Alcohol/week: 0.0 standard drinks of alcohol   Drug use: No   "

## 2024-03-31 ENCOUNTER — Encounter: Payer: Self-pay | Admitting: Urgent Care

## 2024-03-31 ENCOUNTER — Other Ambulatory Visit

## 2024-03-31 LAB — ANCA TITERS
Atypical pANCA: <1:20 {titer}
C-ANCA: <1:20 {titer}
P-ANCA: <1:20 {titer}

## 2024-04-01 ENCOUNTER — Ambulatory Visit: Payer: Self-pay | Admitting: Urgent Care

## 2024-04-03 LAB — HEMOGLOBIN A1C
Est. average glucose Bld gHb Est-mCnc: 108 mg/dL
Hgb A1c MFr Bld: 5.4 % (ref 4.8–5.6)

## 2024-04-03 LAB — COMPREHENSIVE METABOLIC PANEL WITH GFR
ALT: 13 IU/L (ref 0–32)
AST: 18 IU/L (ref 0–40)
Albumin: 4.7 g/dL (ref 3.8–4.9)
Alkaline Phosphatase: 152 IU/L — ABNORMAL HIGH (ref 49–135)
BUN/Creatinine Ratio: 16 (ref 9–23)
BUN: 16 mg/dL (ref 6–24)
Bilirubin Total: 0.4 mg/dL (ref 0.0–1.2)
CO2: 20 mmol/L (ref 20–29)
Calcium: 10 mg/dL (ref 8.7–10.2)
Chloride: 99 mmol/L (ref 96–106)
Creatinine, Ser: 0.99 mg/dL (ref 0.57–1.00)
Globulin, Total: 2.9 g/dL (ref 1.5–4.5)
Glucose: 85 mg/dL (ref 70–99)
Potassium: 4.3 mmol/L (ref 3.5–5.2)
Sodium: 139 mmol/L (ref 134–144)
Total Protein: 7.6 g/dL (ref 6.0–8.5)
eGFR: 67 mL/min/1.73

## 2024-04-03 LAB — METHYLMALONIC ACID, SERUM: Methylmalonic Acid: 257 nmol/L (ref 0–378)

## 2024-04-03 LAB — CBC
Hematocrit: 38.8 % (ref 34.0–46.6)
Hemoglobin: 12.8 g/dL (ref 11.1–15.9)
MCH: 29.4 pg (ref 26.6–33.0)
MCHC: 33 g/dL (ref 31.5–35.7)
MCV: 89 fL (ref 79–97)
Platelets: 330 x10E3/uL (ref 150–450)
RBC: 4.36 x10E6/uL (ref 3.77–5.28)
RDW: 13.4 % (ref 11.7–15.4)
WBC: 10.7 x10E3/uL (ref 3.4–10.8)

## 2024-04-03 LAB — VITAMIN B12: Vitamin B-12: 1078 pg/mL (ref 232–1245)

## 2024-04-03 LAB — TSH: TSH: 4.08 u[IU]/mL (ref 0.450–4.500)

## 2024-04-03 LAB — ZINC: Zinc: 83 ug/dL (ref 44–115)

## 2024-04-03 LAB — VITAMIN B1: Thiamine: 87.3 nmol/L (ref 66.5–200.0)

## 2024-04-04 ENCOUNTER — Other Ambulatory Visit: Payer: Self-pay | Admitting: Medical-Surgical

## 2024-04-08 ENCOUNTER — Other Ambulatory Visit

## 2024-04-28 ENCOUNTER — Inpatient Hospital Stay: Payer: PRIVATE HEALTH INSURANCE

## 2024-04-28 ENCOUNTER — Ambulatory Visit: Payer: PRIVATE HEALTH INSURANCE | Admitting: Medical Oncology

## 2024-05-10 ENCOUNTER — Ambulatory Visit: Admitting: Urgent Care

## 2024-05-23 ENCOUNTER — Ambulatory Visit
